# Patient Record
Sex: Female | Born: 1995
Health system: Southern US, Community
[De-identification: ages and names within clinical notes are randomized; demographics above are authoritative.]

## PROBLEM LIST (undated history)

## (undated) DIAGNOSIS — I4510 Unspecified right bundle-branch block: Secondary | ICD-10-CM

## (undated) DIAGNOSIS — E669 Obesity, unspecified: Secondary | ICD-10-CM

## (undated) DIAGNOSIS — E785 Hyperlipidemia, unspecified: Secondary | ICD-10-CM

## (undated) DIAGNOSIS — G4700 Insomnia, unspecified: Secondary | ICD-10-CM

## (undated) DIAGNOSIS — I498 Other specified cardiac arrhythmias: Secondary | ICD-10-CM

## (undated) DIAGNOSIS — F410 Panic disorder [episodic paroxysmal anxiety] without agoraphobia: Secondary | ICD-10-CM

## (undated) DIAGNOSIS — R079 Chest pain, unspecified: Secondary | ICD-10-CM

## (undated) DIAGNOSIS — F419 Anxiety disorder, unspecified: Secondary | ICD-10-CM

## (undated) DIAGNOSIS — M5126 Other intervertebral disc displacement, lumbar region: Secondary | ICD-10-CM

## (undated) HISTORY — DX: Insomnia, unspecified: G47.00

## (undated) HISTORY — DX: Panic disorder (episodic paroxysmal anxiety): F41.0

## (undated) HISTORY — DX: Unspecified right bundle-branch block: I45.10

## (undated) HISTORY — DX: Other specified cardiac arrhythmias: I49.8

## (undated) HISTORY — PX: RHINOPLASTY: SUR1284

## (undated) HISTORY — DX: Obesity, unspecified: E66.9

## (undated) HISTORY — DX: Chest pain, unspecified: R07.9

## (undated) HISTORY — DX: Hyperlipidemia, unspecified: E78.5

---
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
## 2015-05-20 DIAGNOSIS — L709 Acne, unspecified: Secondary | ICD-10-CM | POA: Diagnosis not present

## 2015-05-25 DIAGNOSIS — L700 Acne vulgaris: Secondary | ICD-10-CM | POA: Diagnosis not present

## 2015-08-05 DIAGNOSIS — Z202 Contact with and (suspected) exposure to infections with a predominantly sexual mode of transmission: Secondary | ICD-10-CM | POA: Diagnosis not present

## 2015-08-05 DIAGNOSIS — Z113 Encounter for screening for infections with a predominantly sexual mode of transmission: Secondary | ICD-10-CM | POA: Diagnosis not present

## 2015-08-11 DIAGNOSIS — H00012 Hordeolum externum right lower eyelid: Secondary | ICD-10-CM | POA: Diagnosis not present

## 2015-08-11 DIAGNOSIS — H00015 Hordeolum externum left lower eyelid: Secondary | ICD-10-CM | POA: Diagnosis not present

## 2015-08-11 MED FILL — ERYTHROMYCIN EYE OINTMENT: 5 | 7 days supply | Qty: 4 | Fill #0

## 2015-11-16 DIAGNOSIS — Z01419 Encounter for gynecological examination (general) (routine) without abnormal findings: Secondary | ICD-10-CM | POA: Diagnosis not present

## 2015-11-16 DIAGNOSIS — Z113 Encounter for screening for infections with a predominantly sexual mode of transmission: Secondary | ICD-10-CM | POA: Diagnosis not present

## 2015-11-16 DIAGNOSIS — Z6820 Body mass index (BMI) 20.0-20.9, adult: Secondary | ICD-10-CM | POA: Diagnosis not present

## 2015-11-20 DIAGNOSIS — L709 Acne, unspecified: Secondary | ICD-10-CM | POA: Diagnosis not present

## 2015-11-20 MED FILL — OXYCODONE-APAP 7.5-325 MG: 7.5-325 | 4 days supply | Qty: 40 | Fill #0

## 2015-11-20 MED FILL — ONDANSETRON ODT 8 MG TABLET: 8 | 3 days supply | Qty: 8 | Fill #0

## 2015-11-23 MED FILL — TRETINOIN 0.025% CREAM: 0.025 | 90 days supply | Qty: 45 | Fill #0

## 2016-02-26 DIAGNOSIS — M533 Sacrococcygeal disorders, not elsewhere classified: Secondary | ICD-10-CM | POA: Diagnosis not present

## 2016-02-26 DIAGNOSIS — M9905 Segmental and somatic dysfunction of pelvic region: Secondary | ICD-10-CM | POA: Diagnosis not present

## 2016-02-26 DIAGNOSIS — M546 Pain in thoracic spine: Secondary | ICD-10-CM | POA: Diagnosis not present

## 2016-02-26 DIAGNOSIS — M9903 Segmental and somatic dysfunction of lumbar region: Secondary | ICD-10-CM | POA: Diagnosis not present

## 2016-02-26 DIAGNOSIS — M9902 Segmental and somatic dysfunction of thoracic region: Secondary | ICD-10-CM | POA: Diagnosis not present

## 2016-02-26 DIAGNOSIS — M545 Low back pain: Secondary | ICD-10-CM | POA: Diagnosis not present

## 2016-02-29 DIAGNOSIS — M9903 Segmental and somatic dysfunction of lumbar region: Secondary | ICD-10-CM | POA: Diagnosis not present

## 2016-02-29 DIAGNOSIS — M9905 Segmental and somatic dysfunction of pelvic region: Secondary | ICD-10-CM | POA: Diagnosis not present

## 2016-02-29 DIAGNOSIS — M546 Pain in thoracic spine: Secondary | ICD-10-CM | POA: Diagnosis not present

## 2016-02-29 DIAGNOSIS — M533 Sacrococcygeal disorders, not elsewhere classified: Secondary | ICD-10-CM | POA: Diagnosis not present

## 2016-02-29 DIAGNOSIS — M9902 Segmental and somatic dysfunction of thoracic region: Secondary | ICD-10-CM | POA: Diagnosis not present

## 2016-02-29 DIAGNOSIS — M545 Low back pain: Secondary | ICD-10-CM | POA: Diagnosis not present

## 2016-03-03 DIAGNOSIS — M545 Low back pain: Secondary | ICD-10-CM | POA: Diagnosis not present

## 2016-03-03 DIAGNOSIS — M546 Pain in thoracic spine: Secondary | ICD-10-CM | POA: Diagnosis not present

## 2016-03-03 DIAGNOSIS — M9902 Segmental and somatic dysfunction of thoracic region: Secondary | ICD-10-CM | POA: Diagnosis not present

## 2016-03-03 DIAGNOSIS — M9903 Segmental and somatic dysfunction of lumbar region: Secondary | ICD-10-CM | POA: Diagnosis not present

## 2016-03-03 DIAGNOSIS — M9905 Segmental and somatic dysfunction of pelvic region: Secondary | ICD-10-CM | POA: Diagnosis not present

## 2016-03-03 DIAGNOSIS — M533 Sacrococcygeal disorders, not elsewhere classified: Secondary | ICD-10-CM | POA: Diagnosis not present

## 2016-03-23 MED FILL — TRETINOIN 0.025% CREAM: 0.025 | 90 days supply | Qty: 45 | Fill #1

## 2016-05-30 DIAGNOSIS — L700 Acne vulgaris: Secondary | ICD-10-CM | POA: Diagnosis not present

## 2016-06-02 DIAGNOSIS — L709 Acne, unspecified: Secondary | ICD-10-CM | POA: Diagnosis not present

## 2016-06-28 MED FILL — TRETINOIN 0.025% CREAM: 0.025 | 90 days supply | Qty: 45 | Fill #2

## 2016-07-08 DIAGNOSIS — L700 Acne vulgaris: Secondary | ICD-10-CM | POA: Diagnosis not present

## 2016-08-18 DIAGNOSIS — L700 Acne vulgaris: Secondary | ICD-10-CM | POA: Diagnosis not present

## 2016-12-23 MED FILL — TRETINOIN 0.025% CREAM: 0.025 | 30 days supply | Qty: 45 | Fill #0

## 2017-02-01 DIAGNOSIS — Z6822 Body mass index (BMI) 22.0-22.9, adult: Secondary | ICD-10-CM | POA: Diagnosis not present

## 2017-02-01 DIAGNOSIS — Z113 Encounter for screening for infections with a predominantly sexual mode of transmission: Secondary | ICD-10-CM | POA: Diagnosis not present

## 2017-02-01 DIAGNOSIS — Z01419 Encounter for gynecological examination (general) (routine) without abnormal findings: Secondary | ICD-10-CM | POA: Diagnosis not present

## 2017-03-20 MED FILL — TRETINOIN 0.025% CREAM: 0.025 | 60 days supply | Qty: 90 | Fill #1

## 2017-10-16 MED FILL — TRETINOIN 0.025% CREAM: 0.025 | 30 days supply | Qty: 45 | Fill #2

## 2017-11-20 DIAGNOSIS — Z1389 Encounter for screening for other disorder: Secondary | ICD-10-CM | POA: Diagnosis not present

## 2017-11-20 DIAGNOSIS — Z6823 Body mass index (BMI) 23.0-23.9, adult: Secondary | ICD-10-CM | POA: Diagnosis not present

## 2017-11-20 DIAGNOSIS — Z0000 Encounter for general adult medical examination without abnormal findings: Secondary | ICD-10-CM | POA: Diagnosis not present

## 2018-03-07 DIAGNOSIS — L709 Acne, unspecified: Secondary | ICD-10-CM | POA: Diagnosis not present

## 2018-04-11 DIAGNOSIS — Z113 Encounter for screening for infections with a predominantly sexual mode of transmission: Secondary | ICD-10-CM | POA: Diagnosis not present

## 2018-04-11 DIAGNOSIS — Z6825 Body mass index (BMI) 25.0-25.9, adult: Secondary | ICD-10-CM | POA: Diagnosis not present

## 2018-04-11 DIAGNOSIS — Z01419 Encounter for gynecological examination (general) (routine) without abnormal findings: Secondary | ICD-10-CM | POA: Diagnosis not present

## 2018-04-12 DIAGNOSIS — L700 Acne vulgaris: Secondary | ICD-10-CM | POA: Diagnosis not present

## 2018-04-12 DIAGNOSIS — Z5181 Encounter for therapeutic drug level monitoring: Secondary | ICD-10-CM | POA: Diagnosis not present

## 2018-04-17 MED FILL — MYORISAN 40 MG CAPSULE: 40 | 30 days supply | Qty: 30 | Fill #0

## 2018-05-07 DIAGNOSIS — R1031 Right lower quadrant pain: Secondary | ICD-10-CM | POA: Diagnosis not present

## 2018-05-07 DIAGNOSIS — R82998 Other abnormal findings in urine: Secondary | ICD-10-CM | POA: Diagnosis not present

## 2018-05-07 DIAGNOSIS — Z3200 Encounter for pregnancy test, result unknown: Secondary | ICD-10-CM | POA: Diagnosis not present

## 2018-05-16 DIAGNOSIS — K130 Diseases of lips: Secondary | ICD-10-CM | POA: Diagnosis not present

## 2018-05-16 DIAGNOSIS — Z5181 Encounter for therapeutic drug level monitoring: Secondary | ICD-10-CM | POA: Diagnosis not present

## 2018-05-16 DIAGNOSIS — L709 Acne, unspecified: Secondary | ICD-10-CM | POA: Diagnosis not present

## 2018-05-21 MED FILL — MYORISAN 40 MG CAPSULE: 40 | 30 days supply | Qty: 30 | Fill #0

## 2018-06-26 DIAGNOSIS — Z5181 Encounter for therapeutic drug level monitoring: Secondary | ICD-10-CM | POA: Diagnosis not present

## 2018-06-26 DIAGNOSIS — K130 Diseases of lips: Secondary | ICD-10-CM | POA: Diagnosis not present

## 2018-06-26 DIAGNOSIS — D229 Melanocytic nevi, unspecified: Secondary | ICD-10-CM | POA: Diagnosis not present

## 2018-07-26 DIAGNOSIS — Z6824 Body mass index (BMI) 24.0-24.9, adult: Secondary | ICD-10-CM | POA: Diagnosis not present

## 2018-07-26 DIAGNOSIS — H00019 Hordeolum externum unspecified eye, unspecified eyelid: Secondary | ICD-10-CM | POA: Diagnosis not present

## 2018-07-26 DIAGNOSIS — Z1331 Encounter for screening for depression: Secondary | ICD-10-CM | POA: Diagnosis not present

## 2018-11-18 ENCOUNTER — Emergency Department (HOSPITAL_COMMUNITY)
Admission: EM | Admit: 2018-11-18 | Discharge: 2018-11-18 | Disposition: A | Discharge status: Home / Self Care | Payer: 59 | Attending: Emergency Medicine | Admitting: Emergency Medicine

## 2018-11-18 ENCOUNTER — Other Ambulatory Visit: Payer: Self-pay

## 2018-11-18 DIAGNOSIS — Z043 Encounter for examination and observation following other accident: Secondary | ICD-10-CM | POA: Insufficient documentation

## 2018-11-18 DIAGNOSIS — Z5321 Procedure and treatment not carried out due to patient leaving prior to being seen by health care provider: Secondary | ICD-10-CM | POA: Diagnosis not present

## 2018-11-18 DIAGNOSIS — W540XXA Bitten by dog, initial encounter: Secondary | ICD-10-CM | POA: Insufficient documentation

## 2018-11-18 NOTE — ED Triage Notes (Signed)
Pt was bitten by a neighbor's dog. States she feels confident that dog does not have rabies, but is concerned for infection.

## 2018-11-18 NOTE — ED Notes (Signed)
Pt stated she is leaving b/c of wait.

## 2018-12-10 DIAGNOSIS — Z0000 Encounter for general adult medical examination without abnormal findings: Secondary | ICD-10-CM | POA: Diagnosis not present

## 2018-12-12 DIAGNOSIS — Z0000 Encounter for general adult medical examination without abnormal findings: Secondary | ICD-10-CM | POA: Diagnosis not present

## 2018-12-12 DIAGNOSIS — H00019 Hordeolum externum unspecified eye, unspecified eyelid: Secondary | ICD-10-CM | POA: Diagnosis not present

## 2018-12-12 DIAGNOSIS — E785 Hyperlipidemia, unspecified: Secondary | ICD-10-CM | POA: Diagnosis not present

## 2018-12-12 DIAGNOSIS — Z1331 Encounter for screening for depression: Secondary | ICD-10-CM | POA: Diagnosis not present

## 2019-04-02 DIAGNOSIS — I890 Lymphedema, not elsewhere classified: Secondary | ICD-10-CM | POA: Diagnosis not present

## 2019-04-16 DIAGNOSIS — Z01419 Encounter for gynecological examination (general) (routine) without abnormal findings: Secondary | ICD-10-CM | POA: Diagnosis not present

## 2019-04-16 DIAGNOSIS — Z6826 Body mass index (BMI) 26.0-26.9, adult: Secondary | ICD-10-CM | POA: Diagnosis not present

## 2019-04-16 DIAGNOSIS — Z113 Encounter for screening for infections with a predominantly sexual mode of transmission: Secondary | ICD-10-CM | POA: Diagnosis not present

## 2019-06-12 MED FILL — METHOCARBAMOL 500 MG TABS: 500 | 7 days supply | Qty: 20 | Fill #0

## 2019-07-10 DIAGNOSIS — Z1152 Encounter for screening for COVID-19: Secondary | ICD-10-CM | POA: Diagnosis not present

## 2019-07-10 DIAGNOSIS — J0100 Acute maxillary sinusitis, unspecified: Secondary | ICD-10-CM | POA: Diagnosis not present

## 2019-07-10 MED FILL — AZITHROMYCIN 250 MG TABLET: 250 | 5 days supply | Qty: 6 | Fill #0

## 2019-08-25 DIAGNOSIS — Z20828 Contact with and (suspected) exposure to other viral communicable diseases: Secondary | ICD-10-CM | POA: Diagnosis not present

## 2019-11-22 ENCOUNTER — Other Ambulatory Visit: Payer: Self-pay | Admitting: Neurological Surgery

## 2019-11-22 ENCOUNTER — Other Ambulatory Visit (HOSPITAL_COMMUNITY): Payer: Self-pay | Admitting: Neurological Surgery

## 2019-11-22 DIAGNOSIS — M5416 Radiculopathy, lumbar region: Secondary | ICD-10-CM | POA: Diagnosis not present

## 2019-11-22 DIAGNOSIS — M4316 Spondylolisthesis, lumbar region: Secondary | ICD-10-CM

## 2019-11-22 DIAGNOSIS — M545 Low back pain: Secondary | ICD-10-CM | POA: Diagnosis not present

## 2019-12-03 ENCOUNTER — Ambulatory Visit (HOSPITAL_COMMUNITY)
Admission: RE | Admit: 2019-12-03 | Discharge: 2019-12-03 | Disposition: A | Discharge status: Home / Self Care | Payer: 59 | Source: Ambulatory Visit | Attending: Neurological Surgery | Admitting: Neurological Surgery

## 2019-12-03 DIAGNOSIS — M4316 Spondylolisthesis, lumbar region: Secondary | ICD-10-CM | POA: Insufficient documentation

## 2019-12-03 DIAGNOSIS — M545 Low back pain: Secondary | ICD-10-CM | POA: Diagnosis not present

## 2019-12-05 DIAGNOSIS — M5127 Other intervertebral disc displacement, lumbosacral region: Secondary | ICD-10-CM | POA: Diagnosis not present

## 2019-12-09 DIAGNOSIS — M5417 Radiculopathy, lumbosacral region: Secondary | ICD-10-CM | POA: Diagnosis not present

## 2019-12-09 DIAGNOSIS — M5117 Intervertebral disc disorders with radiculopathy, lumbosacral region: Secondary | ICD-10-CM | POA: Diagnosis not present

## 2020-01-20 DIAGNOSIS — Z113 Encounter for screening for infections with a predominantly sexual mode of transmission: Secondary | ICD-10-CM | POA: Diagnosis not present

## 2020-01-20 DIAGNOSIS — Z3202 Encounter for pregnancy test, result negative: Secondary | ICD-10-CM | POA: Diagnosis not present

## 2020-01-20 DIAGNOSIS — Z30433 Encounter for removal and reinsertion of intrauterine contraceptive device: Secondary | ICD-10-CM | POA: Diagnosis not present

## 2020-02-12 DIAGNOSIS — M5127 Other intervertebral disc displacement, lumbosacral region: Secondary | ICD-10-CM | POA: Diagnosis not present

## 2020-02-20 ENCOUNTER — Other Ambulatory Visit (HOSPITAL_COMMUNITY): Payer: Self-pay | Admitting: Internal Medicine

## 2020-02-20 DIAGNOSIS — F909 Attention-deficit hyperactivity disorder, unspecified type: Secondary | ICD-10-CM | POA: Diagnosis not present

## 2020-02-20 DIAGNOSIS — F410 Panic disorder [episodic paroxysmal anxiety] without agoraphobia: Secondary | ICD-10-CM | POA: Diagnosis not present

## 2020-02-20 MED FILL — PARoxetine HCL 10 MG TABS: 10 | 30 days supply | Qty: 30 | Fill #0

## 2020-03-03 DIAGNOSIS — M5117 Intervertebral disc disorders with radiculopathy, lumbosacral region: Secondary | ICD-10-CM | POA: Diagnosis not present

## 2020-03-03 DIAGNOSIS — M5417 Radiculopathy, lumbosacral region: Secondary | ICD-10-CM | POA: Diagnosis not present

## 2020-03-25 MED FILL — PARoxetine HCL 10 MG TABS: 10 | 30 days supply | Qty: 30 | Fill #1

## 2020-04-22 MED FILL — PARoxetine HCL 10 MG TABS: 10 | 30 days supply | Qty: 30 | Fill #2

## 2020-05-25 MED FILL — PARoxetine HCL 10 MG TABS: 10 | 30 days supply | Qty: 30 | Fill #3

## 2020-06-22 MED FILL — PARoxetine HCL 10 MG TABS: 10 | 30 days supply | Qty: 30 | Fill #4

## 2020-07-28 ENCOUNTER — Other Ambulatory Visit (HOSPITAL_COMMUNITY): Payer: Self-pay | Admitting: Internal Medicine

## 2020-07-28 MED FILL — PARoxetine HCL 10 MG TABS: 10 | 90 days supply | Qty: 90 | Fill #0

## 2020-12-14 ENCOUNTER — Other Ambulatory Visit (HOSPITAL_COMMUNITY): Payer: Self-pay

## 2020-12-14 MED FILL — Paroxetine HCl Tab 10 MG: ORAL | 90 days supply | Qty: 90 | Fill #0 | Status: AC

## 2021-03-19 ENCOUNTER — Other Ambulatory Visit (HOSPITAL_COMMUNITY): Payer: Self-pay

## 2021-03-19 MED FILL — Paroxetine HCl Tab 10 MG: ORAL | 30 days supply | Qty: 30 | Fill #1 | Status: AC

## 2021-04-21 ENCOUNTER — Other Ambulatory Visit (HOSPITAL_COMMUNITY): Payer: Self-pay

## 2021-04-21 MED FILL — Paroxetine HCl Tab 10 MG: ORAL | 30 days supply | Qty: 30 | Fill #2 | Status: AC

## 2021-05-24 ENCOUNTER — Other Ambulatory Visit (HOSPITAL_COMMUNITY): Payer: Self-pay

## 2021-05-24 MED FILL — Paroxetine HCl Tab 10 MG: ORAL | 30 days supply | Qty: 30 | Fill #3 | Status: AC

## 2021-06-23 ENCOUNTER — Other Ambulatory Visit (HOSPITAL_COMMUNITY): Payer: Self-pay

## 2021-06-25 ENCOUNTER — Other Ambulatory Visit (HOSPITAL_COMMUNITY): Payer: Self-pay

## 2021-06-25 MED ORDER — PAROXETINE HCL 10 MG PO TABS
10.0000 mg | ORAL_TABLET | Freq: Every day | ORAL | 0 refills | Status: DC
  Filled 2021-06-25: qty 30, 30d supply, fill #0

## 2021-06-30 DIAGNOSIS — F909 Attention-deficit hyperactivity disorder, unspecified type: Secondary | ICD-10-CM | POA: Diagnosis not present

## 2021-06-30 DIAGNOSIS — R7989 Other specified abnormal findings of blood chemistry: Secondary | ICD-10-CM | POA: Diagnosis not present

## 2021-06-30 DIAGNOSIS — F410 Panic disorder [episodic paroxysmal anxiety] without agoraphobia: Secondary | ICD-10-CM | POA: Diagnosis not present

## 2021-07-01 ENCOUNTER — Other Ambulatory Visit (HOSPITAL_COMMUNITY): Payer: Self-pay

## 2021-07-01 MED ORDER — PAROXETINE HCL 20 MG PO TABS
20.0000 mg | ORAL_TABLET | ORAL | 3 refills | Status: DC
  Filled 2021-07-01: qty 30, 30d supply, fill #0
  Filled 2021-08-31: qty 30, 30d supply, fill #1
  Filled 2021-10-06: qty 30, 30d supply, fill #2
  Filled 2021-11-05: qty 30, 30d supply, fill #3
  Filled 2021-12-06: qty 90, 90d supply, fill #4
  Filled 2022-03-09: qty 30, 30d supply, fill #5
  Filled 2022-04-08: qty 30, 30d supply, fill #6
  Filled 2022-05-11: qty 30, 30d supply, fill #7

## 2021-08-31 ENCOUNTER — Other Ambulatory Visit (HOSPITAL_COMMUNITY): Payer: Self-pay

## 2021-10-06 ENCOUNTER — Other Ambulatory Visit (HOSPITAL_COMMUNITY): Payer: Self-pay

## 2021-11-05 ENCOUNTER — Other Ambulatory Visit (HOSPITAL_COMMUNITY): Payer: Self-pay

## 2021-12-06 ENCOUNTER — Other Ambulatory Visit (HOSPITAL_COMMUNITY): Payer: Self-pay

## 2022-02-25 ENCOUNTER — Other Ambulatory Visit (HOSPITAL_COMMUNITY): Payer: Self-pay

## 2022-02-25 MED ORDER — MELOXICAM 15 MG PO TABS
15.0000 mg | ORAL_TABLET | Freq: Every day | ORAL | 3 refills | Status: DC | PRN
  Filled 2022-02-25: qty 30, 30d supply, fill #0

## 2022-02-25 MED ORDER — GABAPENTIN 100 MG PO CAPS
100.0000 mg | ORAL_CAPSULE | Freq: Two times a day (BID) | ORAL | 1 refills | Status: DC | PRN
  Filled 2022-02-25: qty 60, 10d supply, fill #0

## 2022-03-09 ENCOUNTER — Other Ambulatory Visit (HOSPITAL_COMMUNITY): Payer: Self-pay

## 2022-03-10 ENCOUNTER — Other Ambulatory Visit (HOSPITAL_COMMUNITY): Payer: Self-pay

## 2022-04-08 ENCOUNTER — Other Ambulatory Visit (HOSPITAL_COMMUNITY): Payer: Self-pay

## 2022-05-11 ENCOUNTER — Other Ambulatory Visit (HOSPITAL_COMMUNITY): Payer: Self-pay

## 2022-05-12 ENCOUNTER — Other Ambulatory Visit (HOSPITAL_COMMUNITY): Payer: Self-pay

## 2022-05-13 ENCOUNTER — Other Ambulatory Visit (HOSPITAL_COMMUNITY): Payer: Self-pay

## 2022-05-13 MED ORDER — WEGOVY 1 MG/0.5ML ~~LOC~~ SOAJ
1.0000 mg | SUBCUTANEOUS | 1 refills | Status: DC
  Filled 2022-05-13: qty 2, 28d supply, fill #0

## 2022-05-13 MED ORDER — WEGOVY 0.5 MG/0.5ML ~~LOC~~ SOAJ
0.5000 mg | SUBCUTANEOUS | 0 refills | Status: DC
  Filled 2022-05-13: qty 2, 28d supply, fill #0

## 2022-05-16 ENCOUNTER — Other Ambulatory Visit (HOSPITAL_COMMUNITY): Payer: Self-pay

## 2022-05-17 ENCOUNTER — Other Ambulatory Visit (HOSPITAL_COMMUNITY): Payer: Self-pay

## 2022-05-17 MED ORDER — OZEMPIC (0.25 OR 0.5 MG/DOSE) 2 MG/3ML ~~LOC~~ SOPN
0.2500 mg | PEN_INJECTOR | SUBCUTANEOUS | 1 refills | Status: AC
  Filled 2022-05-17: qty 3, 34d supply, fill #0
  Filled 2022-05-20 (×2): qty 3, 28d supply, fill #0

## 2022-05-20 ENCOUNTER — Other Ambulatory Visit (HOSPITAL_COMMUNITY): Payer: Self-pay

## 2022-05-25 ENCOUNTER — Other Ambulatory Visit (HOSPITAL_COMMUNITY): Payer: Self-pay

## 2022-05-25 MED ORDER — SERTRALINE HCL 50 MG PO TABS
50.0000 mg | ORAL_TABLET | Freq: Every day | ORAL | 0 refills | Status: DC
  Filled 2022-05-25: qty 30, 30d supply, fill #0

## 2022-05-26 ENCOUNTER — Other Ambulatory Visit (HOSPITAL_COMMUNITY): Payer: Self-pay

## 2022-06-14 ENCOUNTER — Other Ambulatory Visit (HOSPITAL_COMMUNITY): Payer: Self-pay

## 2022-06-14 MED ORDER — SERTRALINE HCL 100 MG PO TABS
100.0000 mg | ORAL_TABLET | Freq: Every day | ORAL | 0 refills | Status: DC
  Filled 2022-06-14: qty 30, 30d supply, fill #0
  Filled 2022-07-29: qty 30, 30d supply, fill #1
  Filled 2022-08-30: qty 30, 30d supply, fill #2

## 2022-06-15 ENCOUNTER — Other Ambulatory Visit (HOSPITAL_COMMUNITY): Payer: Self-pay

## 2022-07-29 ENCOUNTER — Other Ambulatory Visit (HOSPITAL_COMMUNITY): Payer: Self-pay

## 2022-08-30 ENCOUNTER — Other Ambulatory Visit (HOSPITAL_COMMUNITY): Payer: Self-pay

## 2022-09-30 ENCOUNTER — Other Ambulatory Visit (HOSPITAL_COMMUNITY): Payer: Self-pay

## 2022-09-30 MED ORDER — SERTRALINE HCL 100 MG PO TABS
100.0000 mg | ORAL_TABLET | Freq: Every day | ORAL | 11 refills | Status: DC
  Filled 2022-09-30: qty 30, 30d supply, fill #0
  Filled 2022-11-02: qty 30, 30d supply, fill #1
  Filled 2022-12-05: qty 30, 30d supply, fill #2

## 2022-11-02 ENCOUNTER — Other Ambulatory Visit (HOSPITAL_COMMUNITY): Payer: Self-pay

## 2022-12-05 ENCOUNTER — Other Ambulatory Visit (HOSPITAL_COMMUNITY): Payer: Self-pay

## 2022-12-05 ENCOUNTER — Other Ambulatory Visit: Payer: Self-pay

## 2022-12-22 ENCOUNTER — Emergency Department (HOSPITAL_BASED_OUTPATIENT_CLINIC_OR_DEPARTMENT_OTHER)
Admission: EM | Admit: 2022-12-22 | Discharge: 2022-12-22 | Disposition: A | Discharge status: Home / Self Care | Payer: BC Managed Care – PPO | Attending: Emergency Medicine | Admitting: Emergency Medicine

## 2022-12-22 ENCOUNTER — Emergency Department (HOSPITAL_BASED_OUTPATIENT_CLINIC_OR_DEPARTMENT_OTHER): Payer: BC Managed Care – PPO | Admitting: Radiology

## 2022-12-22 ENCOUNTER — Other Ambulatory Visit: Payer: Self-pay

## 2022-12-22 ENCOUNTER — Encounter (HOSPITAL_BASED_OUTPATIENT_CLINIC_OR_DEPARTMENT_OTHER): Payer: Self-pay

## 2022-12-22 DIAGNOSIS — R0789 Other chest pain: Secondary | ICD-10-CM | POA: Diagnosis not present

## 2022-12-22 DIAGNOSIS — D72829 Elevated white blood cell count, unspecified: Secondary | ICD-10-CM | POA: Diagnosis not present

## 2022-12-22 DIAGNOSIS — R079 Chest pain, unspecified: Secondary | ICD-10-CM | POA: Diagnosis present

## 2022-12-22 DIAGNOSIS — F419 Anxiety disorder, unspecified: Secondary | ICD-10-CM | POA: Diagnosis not present

## 2022-12-22 HISTORY — DX: Anxiety disorder, unspecified: F41.9

## 2022-12-22 LAB — BASIC METABOLIC PANEL
Anion gap: 9 (ref 5–15)
BUN: 11 mg/dL (ref 6–20)
CO2: 28 mmol/L (ref 22–32)
Calcium: 10.6 mg/dL — ABNORMAL HIGH (ref 8.9–10.3)
Chloride: 102 mmol/L (ref 98–111)
Creatinine, Ser: 0.69 mg/dL (ref 0.44–1.00)
GFR, Estimated: >60 mL/min (ref >60)
Glucose, Bld: 87 mg/dL (ref 70–99)
Potassium: 4 mmol/L (ref 3.5–5.1)
Sodium: 139 mmol/L (ref 135–145)

## 2022-12-22 LAB — CBC
HCT: 45.2 % (ref 36.0–46.0)
Hemoglobin: 15.9 g/dL — ABNORMAL HIGH (ref 12.0–15.0)
MCH: 31.3 pg (ref 26.0–34.0)
MCHC: 35.2 g/dL (ref 30.0–36.0)
MCV: 89 fL (ref 80.0–100.0)
Platelets: 374 10*3/uL (ref 150–400)
RBC: 5.08 MIL/uL (ref 3.87–5.11)
RDW: 11.9 % (ref 11.5–15.5)
WBC: 11 10*3/uL — ABNORMAL HIGH (ref 4.0–10.5)
nRBC: 0 % (ref 0.0–0.2)

## 2022-12-22 LAB — TROPONIN I (HIGH SENSITIVITY)
Troponin I (High Sensitivity): 2 ng/L (ref <18)
Troponin I (High Sensitivity): <2 ng/L (ref <18)

## 2022-12-22 NOTE — ED Triage Notes (Addendum)
Pt arrived POV for CP/chest discomfort that started tonight, pt denies radiation with CP, no SOB, or n/v. Pt reports has chronic panic attacks and anxiety. Pt seen at [facility] on Sunday for same and dx with RBBB and was advised to f/u with cardiologist which pt has not got an appt yet, started hydroxyzine. VSS, NAD noted.

## 2022-12-22 NOTE — ED Provider Notes (Signed)
[facility] AT [facility] Provider Note  CSN: [id number] Arrival date & time: 12/22/22 4098  Chief Complaint(s) Chest Pain  HPI Shannon Schaefer is a 27 y.o. female with a past medical history listed below including anxiety who presents to the department with left-sided chest discomfort at rest.  Pain is pressure-like.  Nonradiating.  Nonexertional.  No alleviating or aggravating factors.  Similar to prior episodes of anxiety attacks.  Reports that she had pain starting around 9 PM this evening.  Reports taken Vistaril which made the pain go away.  Around 11 PM the pain came back and more intense.  This prompted her visit to the emergency department.  Pain lasted approximately 3 hours and resolved while waiting to be seen.  Currently asymptomatic.  She denies any recent fevers or infections.  No cough or congestion.  No nausea or vomiting.  No shortness of breath.  Denies any prior history of DVT/PE.  No OCP use.  No history of known cancer or autoimmune disorders.  Patient did report having a recent travel to adjacent state but was not in the vehicle for more than 4 to 6 hours without movement.  She did report that she had similar episode yesterday and was seen in [place] ER.  Workup there was reassuring including a negative D-dimer. They Rx'd the Vistaril for anxiety.  The history is provided by the patient.    Past Medical History Past Medical History:  Diagnosis Date   Anxiety    There are no problems to display for this patient.  Home Medication(s) Prior to Admission medications   Medication Sig Start Date End Date Taking? Authorizing Provider  Calcium Carbonate-Vit D-Min (CALCIUM 1200 PO) Take by mouth.    [provider]  diclofenac sodium (VOLTAREN) 1 % GEL Apply 1 application topically 4 (four) times daily. 07/31/11   Rodolph Bong, MD  gabapentin (NEURONTIN) 100 MG capsule Take 1-3 capsules (100-300 mg total) by mouth 2 (two) times  daily as needed for back pain 02/25/22     meloxicam (MOBIC) 15 MG tablet Take 1 tablet (15 mg total) by mouth daily as needed for back pain 02/25/22     Multiple Vitamins-Minerals (MULTIVITAMIN PO) Take by mouth daily.    [provider]  PARoxetine (PAXIL) 10 MG tablet TAKE 1 TABLET BY MOUTH ONCE A DAY IN THE MORNING 07/28/20 07/28/21  Avva, Ravisankar, MD  PARoxetine (PAXIL) 10 MG tablet TAKE 1 TABLET BY MOUTH IN THE MORNING ONCE A DAY 02/20/20 02/19/21  Avva, Joylene Draft, MD  PARoxetine (PAXIL) 10 MG tablet Take 1 tablet (10 mg total) by mouth in the morning 06/25/21     PARoxetine (PAXIL) 20 MG tablet Take 1 tablet (20 mg total) by mouth once daily 06/30/21     Semaglutide,0.25 or 0.5MG /DOS, (OZEMPIC, 0.25 OR 0.5 MG/DOSE,) 2 MG/3ML SOPN Inject 0.25 mg into the skin weekly for 2 weeks and then inject 0.5mg  weekly as directed 05/16/22     Semaglutide-Weight Management (WEGOVY) 0.5 MG/0.5ML SOAJ Inject 0.5 mg into the skin once a week. 05/12/22     Semaglutide-Weight Management (WEGOVY) 1 MG/0.5ML SOAJ Inject 1 mg into the skin once a week. 05/12/22     sertraline (ZOLOFT) 100 MG tablet Take 1 tablet (100 mg total) by mouth daily. 09/30/22     sertraline (ZOLOFT) 50 MG tablet Take 1 tablet (50 mg total) by mouth daily. 05/25/22     vitamin C (ASCORBIC ACID) 500 MG tablet Take 500  mg by mouth daily.    [provider]                                                                                                                                    Allergies Patient has no known allergies.  Review of Systems Review of Systems As noted in HPI  Physical Exam Vital Signs  I have reviewed the triage vital signs BP 112/83 (BP Location: Right Arm)   Pulse 83   Temp 98 F (36.7 C) (Oral)   Resp 19   Ht 5\' 5"  (1.651 m)   Wt 68 kg   SpO2 100%   BMI 24.96 kg/m   Physical Exam Vitals reviewed.  Constitutional:      General: She is not in acute distress.    Appearance: She is  well-developed. She is not diaphoretic.  HENT:     Head: Normocephalic and atraumatic.     Nose: Nose normal.  Eyes:     General: No scleral icterus.       Right eye: No discharge.        Left eye: No discharge.     Conjunctiva/sclera: Conjunctivae normal.     Pupils: Pupils are equal, round, and reactive to light.  Cardiovascular:     Rate and Rhythm: Normal rate and regular rhythm.     Heart sounds: No murmur heard.    No friction rub. No gallop.  Pulmonary:     Effort: Pulmonary effort is normal. No respiratory distress.     Breath sounds: Normal breath sounds. No stridor. No rales.  Abdominal:     General: There is no distension.     Palpations: Abdomen is soft.     Tenderness: There is no abdominal tenderness.  Musculoskeletal:        General: No tenderness.     Cervical back: Normal range of motion and neck supple.  Skin:    General: Skin is warm and dry.     Findings: No erythema or rash.  Neurological:     Mental Status: She is alert and oriented to person, place, and time.     ED Results and Treatments Labs (all labs ordered are listed, but only abnormal results are displayed) Labs Reviewed  BASIC METABOLIC PANEL - Abnormal; Notable for the following components:      Result Value   Calcium 10.6 (*)    All other components within normal limits  CBC - Abnormal; Notable for the following components:   WBC 11.0 (*)    Hemoglobin 15.9 (*)    All other components within normal limits  PREGNANCY, URINE  TROPONIN I (HIGH SENSITIVITY)  TROPONIN I (HIGH SENSITIVITY)  EKG  EKG Interpretation Date/Time:  Thursday December 22 2022 03:43:21 EDT Ventricular Rate:  69 PR Interval:  169 QRS Duration:  109 QT Interval:  395 QTC Calculation: 420 R Axis:   60  Text Interpretation: Sinus rhythm RSR' in V1 or V2, probably normal variant  Confirmed by  Drema Pry [phone]) on 12/22/2022 4:31:49 AM       Radiology DG Chest 2 View  Result Date: 12/22/2022 CLINICAL DATA:  Chest pain EXAM: CHEST - 2 VIEW COMPARISON:  None Available. FINDINGS: The heart size and mediastinal contours are within normal limits. Both lungs are clear. The visualized skeletal structures are unremarkable. No pneumothorax. IMPRESSION: Normal study. Electronically Signed   By: Charlett Nose M.D.   On: 12/22/2022 01:33    Medications Ordered in ED Medications - No data to display Procedures Procedures  (including critical care time) Medical Decision Making / ED Course   Medical Decision Making Amount and/or Complexity of Data Reviewed Labs: ordered. Decision-making details documented in ED Course. Radiology: ordered and independent interpretation performed. Decision-making details documented in ED Course. ECG/medicine tests: ordered and independent interpretation performed. Decision-making details documented in ED Course.    Chest pain Differential considered listed below  Atypical for ACS.  EKG without acute ischemic changes, dysrhythmias or blocks.  Serial troponins negative x 2.  Low pretest probability for pulmonary embolism.  Offered D-dimer but given recent negative dimer yesterday, patient declined.  Feel this is appropriate in this clinical picture since I have low suspicion for PE.  Presentation is not classic pleuritic dissection or esophageal perforation.  CBC with mild leukocytosis.  No anemia. Metabolic panel without significant electrolyte derangements or renal sufficiency.  Chest x-ray without evidence of pneumonia, pneumothorax, pulmonary edema pleural effusion.  Favors anxiety versus GI related process versus MSK.    Final Clinical Impression(s) / ED Diagnoses Final diagnoses:  Intermittent left-sided chest pain  Anxiety   The patient appears reasonably screened and/or stabilized for discharge and I doubt any other medical condition  or other [facility] requiring further screening, evaluation, or treatment in the ED at this time. I have discussed the findings, Dx and Tx plan with the patient/family who expressed understanding and agree(s) with the plan. Discharge instructions discussed at length. The patient/family was given strict return precautions who verbalized understanding of the instructions. No further questions at time of discharge.  Disposition: Discharge  Condition: Good  ED Discharge Orders     None        Follow Up: Chilton Greathouse, MD [address] [place] [address] [phone]  Call  to schedule an appointment for close follow up    This chart was dictated using voice recognition software.  Despite best efforts to proofread,  errors can occur which can change the documentation meaning.    Nira Conn, MD 12/22/22 [phone]

## 2022-12-29 ENCOUNTER — Other Ambulatory Visit (HOSPITAL_COMMUNITY): Payer: Self-pay

## 2022-12-29 MED ORDER — HYDROXYZINE PAMOATE 25 MG PO CAPS
25.0000 mg | ORAL_CAPSULE | Freq: Every evening | ORAL | 0 refills | Status: DC | PRN
  Filled 2022-12-29: qty 30, 8d supply, fill #0

## 2023-02-07 ENCOUNTER — Encounter: Payer: Self-pay | Admitting: Cardiology

## 2023-02-07 ENCOUNTER — Ambulatory Visit: Payer: BC Managed Care – PPO | Attending: Cardiology | Admitting: Cardiology

## 2023-02-07 VITALS — BP 102/68 | HR 96 | Resp 16 | Ht 65.0 in | Wt 145.6 lb

## 2023-02-07 DIAGNOSIS — E7879 Other disorders of bile acid and cholesterol metabolism: Secondary | ICD-10-CM

## 2023-02-07 DIAGNOSIS — R0789 Other chest pain: Secondary | ICD-10-CM | POA: Diagnosis not present

## 2023-02-07 DIAGNOSIS — F410 Panic disorder [episodic paroxysmal anxiety] without agoraphobia: Secondary | ICD-10-CM

## 2023-02-07 NOTE — Patient Instructions (Signed)
Medication Instructions:  Your physician recommends that you continue on your current medications as directed. Please refer to the Current Medication list given to you today.  *If you need a refill on your cardiac medications before your next appointment, please call your pharmacy*  Lab Work: If you have labs (blood work) drawn today and your tests are completely normal, you will receive your results only by: MyChart Message (if you have MyChart) OR A paper copy in the mail If you have any lab test that is abnormal or we need to change your treatment, we will call you to review the results.  Testing/Procedures: None ordered today.  Follow-Up: At [facility], you and your health needs are our priority.  As part of our continuing mission to provide you with exceptional heart care, we have created designated Provider Care Teams.  These Care Teams include your primary Cardiologist (physician) and Advanced Practice Providers (APPs -  Physician Assistants and Nurse Practitioners) who all work together to provide you with the care you need, when you need it.  We recommend signing up for the patient portal called "MyChart".  Sign up information is provided on this After Visit Summary.  MyChart is used to connect with patients for Virtual Visits (Telemedicine).  Patients are able to view lab/test results, encounter notes, upcoming appointments, etc.  Non-urgent messages can be sent to your provider as well.   To learn more about what you can do with MyChart, go to [email or web address].    Your next appointment:   As needed  Provider:   Yates Decamp, MD

## 2023-02-07 NOTE — Progress Notes (Signed)
Cardiology Office Note:  .   Date:  02/07/2023  ID:  Shannon Schaefer, DOB July 10, 1995, MRN [id number] PCP: Chilton Greathouse, MD  [facility] Providers Cardiologist:  Yates Decamp, MD    History of Present Illness: .   Shannon Schaefer is a 27 y.o. Caucasian female patient with history of panic attacks, familial hypercholesterolemia, seen in the emergency room twice recently last episode on 12/22/2022 referred to cardiology for further evaluation  Discussed the use of AI scribe software for clinical note transcription with the patient, who gave verbal consent to proceed.  History of Present Illness   The patient, with a history of high cholesterol and panic attacks, presents with concerns about chest pain and a previously noted right bundle branch block on EKG. The patient describes the panic attacks as "violent" with a sensation of tightness and pressure in the chest, but with a normal heart rate. The frequency of these attacks is inconsistent, with periods of six months with only one attack, and other periods with multiple attacks close together. The patient has not sought psychological treatment for these attacks but plans to do so.  The chest pain, described as a pressure and tightness, is located in the middle of the chest, high up. Occasionally, the patient experiences a sharp pain under the left breast, which she suspects could be heartburn. This discomfort usually occurs at night, often lasting for one to two hours. The patient manages these symptoms by trying to distract herself and by taking hydroxyzine, which has been effective in reducing the symptoms.  The patient also mentions a lifestyle that includes some physical activity, mainly walking, and a history of vaping. The patient has lost 45 pounds since January and is currently at a weight of 145 pounds, with a goal weight of 130 pounds. The patient does not smoke or drink alcohol regularly and does not have children but plans to in the  future.      Review of Systems  Cardiovascular:  Positive for chest pain and palpitations. Negative for dyspnea on exertion and leg swelling.  Neurological:  Positive for dizziness.    Risk Assessment/Calculations:     Lab Results  Component Value Date   NA 139 12/22/2022   K 4.0 12/22/2022   CO2 28 12/22/2022   GLUCOSE 87 12/22/2022   BUN 11 12/22/2022   CREATININE 0.69 12/22/2022   CALCIUM 10.6 (H) 12/22/2022   GFRNONAA >60 12/22/2022   Lab Results  Component Value Date   WBC 11.0 (H) 12/22/2022   HGB 15.9 (H) 12/22/2022   HCT 45.2 12/22/2022   MCV 89.0 12/22/2022   PLT 374 12/22/2022    External Labs:  Labs 05/06/2022:  Serum glucose 83 mg, BUN 11, creatinine 0.6, EGFR 162 mL, potassium 4.2, AST 39, normal.  ALT chronically elevated, 92.  Alkaline phosphatase normal at 71.  Hb 16.1/HCT 43.7, platelets 337, normal indices.  Total cholesterol 300, triglycerides 220, HDL 51, LDL 205.  Serum cortisol normal at 9.0.  TSH normal at 3.45.  Physical Exam:   VS:  BP 102/68 (BP Location: Left Arm, Patient Position: Sitting, Cuff Size: Normal)   Pulse 96   Resp 16   Ht 5\' 5"  (1.651 m)   Wt 145 lb 9.6 oz (66 kg)   SpO2 98%   BMI 24.23 kg/m    Wt Readings from Last 3 Encounters:  02/07/23 145 lb 9.6 oz (66 kg)  12/22/22 150 lb (68 kg)     Physical Exam  Neck:     Vascular: No carotid bruit or JVD.  Cardiovascular:     Rate and Rhythm: Normal rate and regular rhythm.     Pulses: Intact distal pulses.     Heart sounds: Normal heart sounds. No murmur heard.    No gallop.  Pulmonary:     Effort: Pulmonary effort is normal.     Breath sounds: Normal breath sounds.  Abdominal:     General: Bowel sounds are normal.     Palpations: Abdomen is soft.  Musculoskeletal:     Right lower leg: No edema.     Left lower leg: No edema.     Studies Reviewed: Marland Kitchen    EKG:    EKG Interpretation Date/Time:  Tuesday February 07 2023 13:33:20 EDT Ventricular Rate:  86 PR  Interval:  162 QRS Duration:  88 QT Interval:  368 QTC Calculation: 440 R Axis:   88  Text Interpretation: EKG 02/07/2023: Normal sinus rhythm with rate of 86 bpm, normal axis.  Incomplete right bundle branch block.  Normal EKG.  Compared to 12/22/2022, no significant change. Confirmed by Delrae Rend [phone]) on 02/07/2023 1:52:48 PM    ASSESSMENT AND PLAN: .      ICD-10-CM   1. Non-cardiac chest pain  R07.89 EKG 12-Lead    2. Panic attack  F41.0     3. 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 No current psychological treatment. Hydroxyzine provides relief. -Consider seeking psychological treatment for panic attacks. -Continue Hydroxyzine as needed.  Chest Pain Central, high up chest pressure, occasionally sharp pain under left breast, predominantly at night. Lasts 1-2 hours. Not associated with heart rate changes. Has correlation with panic attacks. -No further cardiac workup indicated at this time as symptoms are not suggestive of cardiac etiology.  Familial hyperlipidemia Fortunately no family history of premature coronary disease. Total cholesterol 300, LDL 205 as of January 2024. No family history of early heart disease. Patient is of childbearing age and planning to extend family. -Defer lipid-lowering therapy until completion of family planning. -Encourage smoking cessation for both patient and husband.  Incomplete Right Bundle Branch Block Noted on EKG. No associated symptoms.  Normal EKG.  I reassured her. -No specific treatment needed.  Weight Management Lost 45 pounds since January 2024. Current weight 145, goal weight 130-140. -Encourage maintenance of current weight, no need to reach 130.  Tobacco Use Daily vaping. -Strongly advise smoking cessation for both patient and husband, especially in light of hyperlipidemia.   Overall patient's symptoms of chest pain,  palpitations, or clearly not exertional and occur at rest, suspect noncardiac etiology.  I have reassured her.  She was also concerned about incomplete right bundle branch block and I reassured her stating that it is a normal EKG and a normal finding.  She was very pleased with the evaluation.  And as discussed above, would hold off on any lipid management at this time until she finishes her family-planning/childbearing age.  She plans to extend her family.  I have reviewed all her charts and external records as well as external labs.  Signed,  Yates Decamp, MD, [facility] 02/07/2023, 5:52 PM

## 2023-03-14 ENCOUNTER — Other Ambulatory Visit (HOSPITAL_COMMUNITY): Payer: Self-pay

## 2023-03-14 MED ORDER — HYDROXYZINE PAMOATE 25 MG PO CAPS
25.0000 mg | ORAL_CAPSULE | Freq: Every day | ORAL | 3 refills | Status: DC
  Filled 2023-03-14: qty 90, 30d supply, fill #0

## 2023-03-14 MED ORDER — ESCITALOPRAM OXALATE 20 MG PO TABS
20.0000 mg | ORAL_TABLET | Freq: Every day | ORAL | 3 refills | Status: DC
  Filled 2023-03-14: qty 30, 30d supply, fill #0
  Filled 2023-04-18: qty 90, 90d supply, fill #1
  Filled 2023-07-17: qty 90, 90d supply, fill #2
  Filled 2023-10-02: qty 30, 30d supply, fill #3
  Filled 2023-11-27: qty 30, 30d supply, fill #4
  Filled 2023-12-27: qty 90, 90d supply, fill #5

## 2023-04-18 ENCOUNTER — Other Ambulatory Visit (HOSPITAL_COMMUNITY): Payer: Self-pay

## 2023-05-22 ENCOUNTER — Other Ambulatory Visit (HOSPITAL_COMMUNITY): Payer: Self-pay

## 2023-05-22 MED ORDER — OSELTAMIVIR PHOSPHATE 75 MG PO CAPS
75.0000 mg | ORAL_CAPSULE | Freq: Two times a day (BID) | ORAL | 0 refills | Status: DC
  Filled 2023-05-22: qty 10, 5d supply, fill #0

## 2023-07-17 ENCOUNTER — Other Ambulatory Visit (HOSPITAL_COMMUNITY): Payer: Self-pay

## 2023-09-28 ENCOUNTER — Other Ambulatory Visit (HOSPITAL_COMMUNITY): Payer: Self-pay

## 2023-09-28 MED ORDER — GABAPENTIN 300 MG PO CAPS
300.0000 mg | ORAL_CAPSULE | Freq: Three times a day (TID) | ORAL | 1 refills | Status: DC
Start: 2023-09-28 — End: 2023-10-05
  Filled 2023-09-28: qty 90, 30d supply, fill #0

## 2023-09-28 MED ORDER — METHOCARBAMOL 750 MG PO TABS
750.0000 mg | ORAL_TABLET | Freq: Three times a day (TID) | ORAL | 1 refills | Status: DC
  Filled 2023-09-28: qty 90, 30d supply, fill #0

## 2023-09-28 MED ORDER — MELOXICAM 15 MG PO TABS
15.0000 mg | ORAL_TABLET | Freq: Every day | ORAL | 3 refills | Status: DC | PRN
  Filled 2023-09-28: qty 30, 30d supply, fill #0

## 2023-09-29 ENCOUNTER — Other Ambulatory Visit (HOSPITAL_COMMUNITY): Payer: Self-pay

## 2023-09-29 MED ORDER — OXYCODONE-ACETAMINOPHEN 5-325 MG PO TABS
1.0000 | ORAL_TABLET | Freq: Four times a day (QID) | ORAL | 0 refills | Status: DC | PRN
Start: 2023-09-29 — End: 2023-10-05
  Filled 2023-09-29: qty 40, 10d supply, fill #0

## 2023-10-02 ENCOUNTER — Encounter: Payer: Self-pay | Admitting: Emergency Medicine

## 2023-10-02 ENCOUNTER — Emergency Department
Admission: EM | Admit: 2023-10-02 | Discharge: 2023-10-02 | Disposition: A | Discharge status: Home / Self Care | Attending: Emergency Medicine | Admitting: Emergency Medicine

## 2023-10-02 ENCOUNTER — Other Ambulatory Visit: Payer: Self-pay

## 2023-10-02 ENCOUNTER — Other Ambulatory Visit (HOSPITAL_COMMUNITY): Payer: Self-pay

## 2023-10-02 ENCOUNTER — Emergency Department

## 2023-10-02 DIAGNOSIS — M545 Low back pain, unspecified: Secondary | ICD-10-CM

## 2023-10-02 DIAGNOSIS — M5126 Other intervertebral disc displacement, lumbar region: Secondary | ICD-10-CM | POA: Diagnosis not present

## 2023-10-02 MED ORDER — HYDROMORPHONE HCL 1 MG/ML IJ SOLN
1.0000 mg | Freq: Once | INTRAMUSCULAR | Status: AC
  Administered 2023-10-02: 1 mg via INTRAMUSCULAR
  Filled 2023-10-02: qty 1

## 2023-10-02 MED ORDER — HYDROMORPHONE HCL 1 MG/ML IJ SOLN
1.0000 mg | Freq: Once | INTRAMUSCULAR | Status: AC
  Administered 2023-10-02: 1 mg via INTRAMUSCULAR

## 2023-10-02 MED ORDER — KETOROLAC TROMETHAMINE 30 MG/ML IJ SOLN
30.0000 mg | Freq: Once | INTRAMUSCULAR | Status: AC
  Administered 2023-10-02: 30 mg via INTRAMUSCULAR
  Filled 2023-10-02: qty 1

## 2023-10-02 MED ORDER — PREDNISONE 20 MG PO TABS
60.0000 mg | ORAL_TABLET | Freq: Once | ORAL | Status: AC
  Administered 2023-10-02: 60 mg via ORAL
  Filled 2023-10-02: qty 3

## 2023-10-02 MED ORDER — PREDNISONE 50 MG PO TABS
50.0000 mg | ORAL_TABLET | Freq: Every day | ORAL | 0 refills | Status: DC
  Filled 2023-10-02: qty 5, 5d supply, fill #0

## 2023-10-02 MED ORDER — HYDROMORPHONE HCL 1 MG/ML IJ SOLN
1.0000 mg | Freq: Once | INTRAMUSCULAR | Status: DC
  Filled 2023-10-02: qty 1

## 2023-10-02 NOTE — ED Notes (Signed)
 Pt complains of lower back pain that is worse with movement. 9/10 .  States that her best position of comfort his being on her hand and knees to relive the pressure off her back.

## 2023-10-02 NOTE — ED Triage Notes (Signed)
 Pt in via POV, reports severe lower back pain due to ruptured disc at L5/S1.  Denies any new injuries but states it has been irritated since 5/9 and pain has progressed since.  Patient w/ difficulty standing, ambulating and sitting upright due to pain.

## 2023-10-02 NOTE — Discharge Instructions (Signed)
 Take the prednisone as prescribed starting tomorrow and finish the full 5-day course.  Continue taking your other pain medications as prescribed.  Make an appointment to follow-up with the neurosurgeon.  Return to the ER for new, worsening, or persistent severe pain, any weakness or numbness, urinary incontinence or retention, inability to hold your bowel movements, or any other new or worsening symptoms that concern you.

## 2023-10-02 NOTE — ED Provider Notes (Signed)
 [facility] Provider Note    Event Date/Time   First MD Initiated Contact with Patient 10/02/23 [phone]     (approximate)   History   Back Pain   HPI  Shannon Schaefer is a 28 y.o. female with a history of anxiety and high cholesterol who presents with an acute exacerbation of low back pain.  The patient states that she has had low back pain from a "ruptured" disc for the last few years.  Her most recent flareup was treated successfully with Toradol, gabapentin , and methocarbamol , but the patient has been taking these medications over the last few weeks without relief.  She has been prescribed Percocet as well with only minimal relief.  She reports increased tingling to her legs but no numbness.  She has difficulty ambulating due to pain but not weakness.  She denies any incontinence.  She has not had any trauma.  I reviewed the past medical records.  The patient's most recent MRI in our system from 2021 shows disc protrusion at L5-S1 compressing the right S1 nerve root.   Physical Exam   Triage Vital Signs: ED Triage Vitals  Encounter Vitals Group     BP 10/02/23 0039 124/79     Systolic BP Percentile --      Diastolic BP Percentile --      Pulse Rate 10/02/23 0039 99     Resp 10/02/23 0039 15     Temp 10/02/23 0039 98.1 F (36.7 C)     Temp Source 10/02/23 0039 Oral     SpO2 10/02/23 0039 100 %     Weight 10/02/23 0040 175 lb (79.4 kg)     Height 10/02/23 0040 5\' 5"  (1.651 m)     Head Circumference --      Peak Flow --      Pain Score 10/02/23 0040 8     Pain Loc --      Pain Education --      Exclude from Growth Chart --     Most recent vital signs: Vitals:   10/02/23 0039  BP: 124/79  Pulse: 99  Resp: 15  Temp: 98.1 F (36.7 C)  SpO2: 100%    General: Alert, uncomfortable appearing, no distress.  CV:  Good peripheral perfusion.  Resp:  Normal effort.  Abd:  No distention.  Other:  No midline spinal tenderness.  Right lumbosacral  paraspinal tenderness.  Motor and sensory intact to lower extremities.   ED Results / Procedures / Treatments   Labs (all labs ordered are listed, but only abnormal results are displayed) Labs Reviewed - No data to display   EKG     RADIOLOGY  MR lumbar spine:   IMPRESSION:  1. Right subarticular disc protrusion at L5-S1, impinging upon the  descending right S1 nerve root in the right lateral recess.  2. Otherwise normal MRI of the lumbar spine.     PROCEDURES:  Critical Care performed: No  Procedures   MEDICATIONS ORDERED IN ED: Medications  HYDROmorphone (DILAUDID) injection 1 mg (has no administration in time range)  ketorolac (TORADOL) 30 MG/ML injection 30 mg (30 mg Intramuscular Given 10/02/23 0131)  HYDROmorphone (DILAUDID) injection 1 mg (1 mg Intramuscular Given 10/02/23 0130)  predniSONE (DELTASONE) tablet 60 mg (60 mg Oral Given 10/02/23 0134)     IMPRESSION / MDM / ASSESSMENT AND PLAN / ED COURSE  I reviewed the triage vital signs and the nursing notes.  28 year old female with PMH as noted  above presents with worsening lower back pain.  The patient has a history of an L5-S1 disc protrusion that was diagnosed several years ago.  She reports that this is the worst flareup that she has had has not been relieved by a medication regimen which has helped previously.  She has increased tingling to the legs but no numbness or weakness.  Differential diagnosis includes, but is not limited to, lumbosacral disc protrusion, herniation, rupture, less likely compression fracture.  I have a low suspicion for cauda equina syndrome given the lack of weakness or numbness.  However given the severe pain and escalating symptoms I think it would be advisable to obtain an MRI to determine whether she may need any acute intervention.  Patient's presentation is most consistent with acute complicated illness / injury requiring diagnostic  workup.  ----------------------------------------- 6:08 AM on 10/02/2023 -----------------------------------------  MRI was disc protrusion at L5-S1 compressing the right S1 nerve root, very similar reading to her MRI from 2021.  Given the lack of any numbness or weakness, there is no indication for emergent neurosurgery consultation.  The patient's pain was markedly improved with the initial pain medications and steroid.  I decided to observe the patient for several hours to monitor for recurrence of the pain, and plan for likely a second dose in the ED prior to sending her home.  She has now been in the ED for over 5 hours and the pain is starting to return.  I ordered a second dose of Dilaudid.  At this time, the patient is stable for discharge.  She will continue Percocet, methocarbamol , and gabapentin  which she already has.  I have added on prednisone.  She has follow-up in late July with an outside neurosurgeon, but wanted referral here, which I have provided.  I counseled her on the results of the workup and gave strict return precautions; she expressed understanding.   FINAL CLINICAL IMPRESSION(S) / ED DIAGNOSES   Final diagnoses:  Chronic low back pain without sciatica, unspecified back pain laterality  Protruded lumbar disc     Rx / DC Orders   ED Discharge Orders          Ordered    predniSONE (DELTASONE) 50 MG tablet  Daily        10/02/23 0607             Note:  This document was prepared using Dragon voice recognition software and may include unintentional dictation errors.    Lind Repine, MD 10/02/23 [phone]

## 2023-10-04 NOTE — Progress Notes (Unsigned)
 Referring Physician:  Avva, Ravisankar, MD [address] [place],  [place] [address]  Primary Physician:  Avva, Ravisankar, MD  History of Present Illness: 10/05/2023 [name] has a history of obesity, hypercholesterolemia, panic anxiety syndrome, sinus arrhythmia.   Seen in ED on 10/02/23 with acute LBP (saw Elsner in the past). 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 Previous pain improved with injections. Unable to get appointment with Dr. Ellery Guthrie until 11/29/23.   She has constant LBP with right posterior leg pain to her foot that started 09/08/23. She has weakness in right leg along with intermittent tingling. Some relief with dilaudid. No other alleviating factors. Pain is worse with everything.   She has been bed bound for last month due to pain.   She is taking percocet, robaxin , and neurontin . Prednisone added by ED on 10/02/23.   Bowel/Bladder Dysfunction: none  Conservative measures:  Physical therapy: has not participated in recently Multimodal medical therapy including regular antiinflammatories:  toradol, gabapentin , robaxin , percocet,  Injections:  Previous ESIs with Dr. Ellery Guthrie in 2021 with relief   Past Surgery: no spinal surgeries  Shannon Schaefer has no symptoms of cervical myelopathy.  The symptoms are causing a significant impact on the patient's life.   Review of Systems:  A 10 point review of systems is negative, except for the pertinent positives and negatives detailed in the HPI.  Past Medical History: Past Medical History:  Diagnosis Date   Anxiety    Chest pain    Hyperlipidemia    Obesity    RBBB    Sinus arrhythmia     Past Surgical History: Past Surgical History:  Procedure Laterality Date   RHINOPLASTY      Allergies: Allergies as of 10/05/2023   (No Known Allergies)    Medications: Outpatient Encounter Medications as of 10/05/2023  Medication Sig   escitalopram  (LEXAPRO ) 20 MG tablet Take 1 tablet (20 mg total) by mouth daily.   gabapentin   (NEURONTIN ) 300 MG capsule Take 1 capsule (300 mg total) by mouth 3 (three) times daily.   hydrOXYzine  (VISTARIL ) 25 MG capsule Take 1 capsule (25 mg total) by mouth at bedtime. Up to three times daily as needed   melatonin 1 MG TABS tablet Take 1 mg by mouth at bedtime.   meloxicam  (MOBIC ) 15 MG tablet Take 1 tablet (15 mg total) by mouth daily as needed for back pain.   methocarbamol  (ROBAXIN ) 750 MG tablet Take 1 tablet (750 mg total) by mouth 3 (three) times daily.   oxyCODONE -acetaminophen  (PERCOCET/ROXICET) 5-325 MG tablet Take 1 tablet by mouth every 6 (six) hours as needed.   predniSONE (DELTASONE) 50 MG tablet Take 1 tablet (50 mg total) by mouth daily at 12 noon for 5 days.   Semaglutide ,0.25 or 0.5MG /DOS, (OZEMPIC , 0.25 OR 0.5 MG/DOSE,) 2 MG/3ML SOPN Inject 0.25 mg into the skin weekly for 2 weeks and then inject 0.5mg  weekly as directed   Semaglutide -Weight Management (WEGOVY ) 0.5 MG/0.5ML SOAJ Inject 0.5 mg into the skin once a week.   [DISCONTINUED] escitalopram  (LEXAPRO ) 20 MG tablet Take 20 mg by mouth daily.   [DISCONTINUED] hydrOXYzine  (VISTARIL ) 25 MG capsule Take 1 capsule (25 mg total) by mouth at bedtime as needed, may take up to every 6 hours if needed   [DISCONTINUED] levonorgestrel  (MIRENA , 52 MG,) 20 MCG/DAY IUD 1 each by Intrauterine route once.   [DISCONTINUED] oseltamivir  (TAMIFLU ) 75 MG capsule Take 1 capsule (75 mg total) by mouth 2 (two) times daily for 5 days as  directed   No facility-administered encounter medications on file as of 10/05/2023.    Social History: Social History   Tobacco Use   Smoking status: Never   Smokeless tobacco: Never  Vaping Use   Vaping status: Every Day   Substances: Nicotine, Flavoring  Substance Use Topics   Alcohol use: Yes   Drug use: No    Family Medical History: Family History  Problem Relation Age of Onset   Hyperlipidemia Mother    Thyroid  disease Mother    Mood Disorder Mother    Raynaud syndrome Mother     Osteoporosis Mother    Hypertension Father    Hyperlipidemia Father     Physical Examination: Vitals:   10/05/23 1043  BP: 112/76    General: Patient is well developed, well nourished, calm, collected, and in no apparent distress. Attention to examination is appropriate.  Respiratory: Patient is breathing without any difficulty.  She is very uncomfortable in the exam room due to pain.    NEUROLOGICAL:     Awake, alert, oriented to person, place, and time.  Speech is clear and fluent. Fund of knowledge is appropriate.   Cranial Nerves: Pupils equal round and reactive to light.  Facial tone is symmetric.    No posterior lumbar tenderness.   No abnormal lesions on exposed skin.   Strength: Side Biceps Triceps Deltoid Interossei Grip Wrist Ext. Wrist Flex.  R 5 5 5 5 5 5 5   L 5 5 5 5 5 5 5    Side Iliopsoas Quads Hamstring PF DF EHL  R 5 5 5  4- 4- 4-  L 5 5 5 5 5 5    She has difficulty standing on heels on right side. She cannot stand on toes on her right foot, she can toe/heel stand on left foot.   Reflexes are 2+ and symmetric at the biceps, brachioradialis, patella and achilles.   Hoffman's is absent.   1-2 beats clonus in bilateral lower extremities.   Bilateral upper and lower extremity sensation is intact to light touch, but diminished in right.   Abnormal gait- she has difficulty walking due to pain.   Medical Decision Making  Imaging: Lumbar MRI dated 10/02/23:  FINDINGS: Segmentation: Standard. Lowest well-formed disc space labeled the L5-S1 level.   Alignment: Trace degenerative retrolisthesis of L5 on S1. Alignment otherwise normal preservation of the normal lumbar lordosis.   Vertebrae: Vertebral body height maintained without acute or chronic fracture. Bone marrow signal intensity within normal limits. No discrete or worrisome osseous lesions. Minimal reactive endplate changes present about the L5-S1 interspace. No other abnormal marrow edema.    Conus medullaris and cauda equina: Conus extends to the L1-2 level. Conus and cauda equina appear normal.   Paraspinal and other soft tissues: Unremarkable.   Disc levels:   Lumbar spine is image from the T12-L1 level inferiorly. No significant findings are seen through the L4-5 level.   L5-S1: Disc desiccation with mild diffuse disc bulge. Superimposed right subarticular disc protrusion impinges upon the descending right S1 nerve root in the right lateral recess (series 9, image 31). Resultant moderate right lateral recess stenosis. Central canal remains patent. No significant foraminal stenosis. Overall, appearance is similar as compared to previous MRI from 12/03/2019.   IMPRESSION: 1. Right subarticular disc protrusion at L5-S1, impinging upon the descending right S1 nerve root in the right lateral recess. 2. Otherwise normal MRI of the lumbar spine.     Electronically Signed   By: Elenor Griffith.D.  On: 10/02/2023 02:35  I have personally reviewed the images and agree with the above interpretation.  Assessment and Plan: Ms. Das has history of right L5-S1 HNP with pain that improved after injections in 2021.   Now with 4 week history of severe constant right sided LBP/buttock pain with right posterior leg pain to her foot. She has tingling and weakness in right leg. She has been bed bound due to pain.   She has known right sided disc L5-S1 impinging on right S1 nerve.   Treatment options discussed with patient and following plan made:   - She has weakness in right foot with severe pain in her back and right leg.  - Dr. Mont Antis discussed options with her. Due to severe pain and weakness, surgery was recommended, specifically a right L5-S1 microdiscectomy.  - Will plan to do this later today.   I spent a total of 45 minutes in face-to-face and non-face-to-face activities related to this patient's care today including review of outside records, review of  imaging, review of symptoms, physical exam, discussion of differential diagnosis, discussion of treatment options, and documentation.   Thank you for involving me in the care of this patient.   Lucetta Russel PA-C Dept. of Neurosurgery   This is an addendum to above.  Given her worsening symptoms and objective weakness on evaluation, Lucetta Russel asked me to evaluate the patient.  On my examination, I confirmed the weakness shown in [name]'s exam.  She has a right S1 radiculopathy due to severe compression of the right S1 nerve root in the lateral recess.  She has objective weakness on evaluation.  She has been unable to get out of bed for the past 4 weeks.  Given her objective weakness, further conservative management is not indicated.  I have recommended surgical intervention with a right sided L5-S1 microdiscectomy.  We will do this on an urgent basis given her objective weakness and worsening condition.  She is on semaglutide .  We would normally hold this for 7 days prior to an elective procedure.  As this is not an elective situation, I feel that the risks of waiting and potentially causing permanent neurologic dysfunction are outweighed by the risks of proceeding.  As such, I recommended proceeding today.  Additionally, she last ate around 9 AM.  We will proceed prior to the 8-hour window being completely over as this is an urgent situation.  I discussed the planned procedure at length with the patient, including the risks, benefits, alternatives, and indications. The risks discussed include but are not limited to bleeding, infection, need for reoperation, spinal fluid leak, stroke, vision loss, anesthetic complication, coma, paralysis, and even death. I also described in detail that improvement was not guaranteed.  The patient expressed understanding of these risks, and asked that we proceed with surgery. I described the surgery in layman's terms, and gave ample opportunity for questions, which  were answered to the best of my ability.

## 2023-10-04 NOTE — H&P (View-Only) (Signed)
 Referring Physician:  Avva, Ravisankar, MD [address] [place],  [place] [address]  Primary Physician:  Avva, Ravisankar, MD  History of Present Illness: 10/05/2023 [name] has a history of obesity, hypercholesterolemia, panic anxiety syndrome, sinus arrhythmia.   Seen in ED on 10/02/23 with acute LBP (saw Shannon Schaefer in the past). 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 Previous pain improved with injections. Unable to get appointment with Dr. Ellery Schaefer until 11/29/23.   She has constant LBP with right posterior leg pain to her foot that started 09/08/23. She has weakness in right leg along with intermittent tingling. Some relief with dilaudid. No other alleviating factors. Pain is worse with everything.   She has been bed bound for last month due to pain.   She is taking percocet, robaxin , and neurontin . Prednisone added by ED on 10/02/23.   Bowel/Bladder Dysfunction: none  Conservative measures:  Physical therapy: has not participated in recently Multimodal medical therapy including regular antiinflammatories:  toradol, gabapentin , robaxin , percocet,  Injections:  Previous ESIs with Dr. Ellery Schaefer in 2021 with relief   Past Surgery: no spinal surgeries  Shannon Schaefer has no symptoms of cervical myelopathy.  The symptoms are causing a significant impact on the patient's life.   Review of Systems:  A 10 point review of systems is negative, except for the pertinent positives and negatives detailed in the HPI.  Past Medical History: Past Medical History:  Diagnosis Date   Anxiety    Chest pain    Hyperlipidemia    Obesity    RBBB    Sinus arrhythmia     Past Surgical History: Past Surgical History:  Procedure Laterality Date   RHINOPLASTY      Allergies: Allergies as of 10/05/2023   (No Known Allergies)    Medications: Outpatient Encounter Medications as of 10/05/2023  Medication Sig   escitalopram  (LEXAPRO ) 20 MG tablet Take 1 tablet (20 mg total) by mouth daily.   gabapentin   (NEURONTIN ) 300 MG capsule Take 1 capsule (300 mg total) by mouth 3 (three) times daily.   hydrOXYzine  (VISTARIL ) 25 MG capsule Take 1 capsule (25 mg total) by mouth at bedtime. Up to three times daily as needed   melatonin 1 MG TABS tablet Take 1 mg by mouth at bedtime.   meloxicam  (MOBIC ) 15 MG tablet Take 1 tablet (15 mg total) by mouth daily as needed for back pain.   methocarbamol  (ROBAXIN ) 750 MG tablet Take 1 tablet (750 mg total) by mouth 3 (three) times daily.   oxyCODONE -acetaminophen  (PERCOCET/ROXICET) 5-325 MG tablet Take 1 tablet by mouth every 6 (six) hours as needed.   predniSONE (DELTASONE) 50 MG tablet Take 1 tablet (50 mg total) by mouth daily at 12 noon for 5 days.   Semaglutide ,0.25 or 0.5MG /DOS, (OZEMPIC , 0.25 OR 0.5 MG/DOSE,) 2 MG/3ML SOPN Inject 0.25 mg into the skin weekly for 2 weeks and then inject 0.5mg  weekly as directed   Semaglutide -Weight Management (WEGOVY ) 0.5 MG/0.5ML SOAJ Inject 0.5 mg into the skin once a week.   [DISCONTINUED] escitalopram  (LEXAPRO ) 20 MG tablet Take 20 mg by mouth daily.   [DISCONTINUED] hydrOXYzine  (VISTARIL ) 25 MG capsule Take 1 capsule (25 mg total) by mouth at bedtime as needed, may take up to every 6 hours if needed   [DISCONTINUED] levonorgestrel  (MIRENA , 52 MG,) 20 MCG/DAY IUD 1 each by Intrauterine route once.   [DISCONTINUED] oseltamivir  (TAMIFLU ) 75 MG capsule Take 1 capsule (75 mg total) by mouth 2 (two) times daily for 5 days as  directed   No facility-administered encounter medications on file as of 10/05/2023.    Social History: Social History   Tobacco Use   Smoking status: Never   Smokeless tobacco: Never  Vaping Use   Vaping status: Every Day   Substances: Nicotine, Flavoring  Substance Use Topics   Alcohol use: Yes   Drug use: No    Family Medical History: Family History  Problem Relation Age of Onset   Hyperlipidemia Mother    Thyroid  disease Mother    Mood Disorder Mother    Raynaud syndrome Mother     Osteoporosis Mother    Hypertension Father    Hyperlipidemia Father     Physical Examination: Vitals:   10/05/23 1043  BP: 112/76    General: Patient is well developed, well nourished, calm, collected, and in no apparent distress. Attention to examination is appropriate.  Respiratory: Patient is breathing without any difficulty.  She is very uncomfortable in the exam room due to pain.    NEUROLOGICAL:     Awake, alert, oriented to person, place, and time.  Speech is clear and fluent. Fund of knowledge is appropriate.   Cranial Nerves: Pupils equal round and reactive to light.  Facial tone is symmetric.    No posterior lumbar tenderness.   No abnormal lesions on exposed skin.   Strength: Side Biceps Triceps Deltoid Interossei Grip Wrist Ext. Wrist Flex.  R 5 5 5 5 5 5 5   L 5 5 5 5 5 5 5    Side Iliopsoas Quads Hamstring PF DF EHL  R 5 5 5  4- 4- 4-  L 5 5 5 5 5 5    She has difficulty standing on heels on right side. She cannot stand on toes on her right foot, she can toe/heel stand on left foot.   Reflexes are 2+ and symmetric at the biceps, brachioradialis, patella and achilles.   Hoffman's is absent.   1-2 beats clonus in bilateral lower extremities.   Bilateral upper and lower extremity sensation is intact to light touch, but diminished in right.   Abnormal gait- she has difficulty walking due to pain.   Medical Decision Making  Imaging: Lumbar MRI dated 10/02/23:  FINDINGS: Segmentation: Standard. Lowest well-formed disc space labeled the L5-S1 level.   Alignment: Trace degenerative retrolisthesis of L5 on S1. Alignment otherwise normal preservation of the normal lumbar lordosis.   Vertebrae: Vertebral body height maintained without acute or chronic fracture. Bone marrow signal intensity within normal limits. No discrete or worrisome osseous lesions. Minimal reactive endplate changes present about the L5-S1 interspace. No other abnormal marrow edema.    Conus medullaris and cauda equina: Conus extends to the L1-2 level. Conus and cauda equina appear normal.   Paraspinal and other soft tissues: Unremarkable.   Disc levels:   Lumbar spine is image from the T12-L1 level inferiorly. No significant findings are seen through the L4-5 level.   L5-S1: Disc desiccation with mild diffuse disc bulge. Superimposed right subarticular disc protrusion impinges upon the descending right S1 nerve root in the right lateral recess (series 9, image 31). Resultant moderate right lateral recess stenosis. Central canal remains patent. No significant foraminal stenosis. Overall, appearance is similar as compared to previous MRI from 12/03/2019.   IMPRESSION: 1. Right subarticular disc protrusion at L5-S1, impinging upon the descending right S1 nerve root in the right lateral recess. 2. Otherwise normal MRI of the lumbar spine.     Electronically Signed   By: Elenor Griffith.D.  On: 10/02/2023 02:35  I have personally reviewed the images and agree with the above interpretation.  Assessment and Plan: Ms. Kuehl has history of right L5-S1 HNP with pain that improved after injections in 2021.   Now with 4 week history of severe constant right sided LBP/buttock pain with right posterior leg pain to her foot. She has tingling and weakness in right leg. She has been bed bound due to pain.   She has known right sided disc L5-S1 impinging on right S1 nerve.   Treatment options discussed with patient and following plan made:   - She has weakness in right foot with severe pain in her back and right leg.  - Dr. Mont Antis discussed options with her. Due to severe pain and weakness, surgery was recommended, specifically a right L5-S1 microdiscectomy.  - Will plan to do this later today.   I spent a total of 45 minutes in face-to-face and non-face-to-face activities related to this patient's care today including review of outside records, review of  imaging, review of symptoms, physical exam, discussion of differential diagnosis, discussion of treatment options, and documentation.   Thank you for involving me in the care of this patient.   Lucetta Russel PA-C Dept. of Neurosurgery   This is an addendum to above.  Given her worsening symptoms and objective weakness on evaluation, Lucetta Russel asked me to evaluate the patient.  On my examination, I confirmed the weakness shown in Ms. Arlynn Mcdermid's exam.  She has a right S1 radiculopathy due to severe compression of the right S1 nerve root in the lateral recess.  She has objective weakness on evaluation.  She has been unable to get out of bed for the past 4 weeks.  Given her objective weakness, further conservative management is not indicated.  I have recommended surgical intervention with a right sided L5-S1 microdiscectomy.  We will do this on an urgent basis given her objective weakness and worsening condition.  She is on semaglutide .  We would normally hold this for 7 days prior to an elective procedure.  As this is not an elective situation, I feel that the risks of waiting and potentially causing permanent neurologic dysfunction are outweighed by the risks of proceeding.  As such, I recommended proceeding today.  Additionally, she last ate around 9 AM.  We will proceed prior to the 8-hour window being completely over as this is an urgent situation.  I discussed the planned procedure at length with the patient, including the risks, benefits, alternatives, and indications. The risks discussed include but are not limited to bleeding, infection, need for reoperation, spinal fluid leak, stroke, vision loss, anesthetic complication, coma, paralysis, and even death. I also described in detail that improvement was not guaranteed.  The patient expressed understanding of these risks, and asked that we proceed with surgery. I described the surgery in layman's terms, and gave ample opportunity for questions, which  were answered to the best of my ability.

## 2023-10-05 ENCOUNTER — Encounter: Payer: Self-pay | Admitting: Orthopedic Surgery

## 2023-10-05 ENCOUNTER — Ambulatory Visit
Admission: RE | Admit: 2023-10-05 | Discharge: 2023-10-05 | Disposition: A | Discharge status: Home / Self Care | Source: Ambulatory Visit | Attending: Neurosurgery | Admitting: Neurosurgery

## 2023-10-05 ENCOUNTER — Ambulatory Visit: Admitting: Urgent Care

## 2023-10-05 ENCOUNTER — Inpatient Hospital Stay: Admission: RE | Admit: 2023-10-05 | Source: Ambulatory Visit

## 2023-10-05 ENCOUNTER — Other Ambulatory Visit: Payer: Self-pay

## 2023-10-05 ENCOUNTER — Ambulatory Visit

## 2023-10-05 ENCOUNTER — Ambulatory Visit: Admitting: Orthopedic Surgery

## 2023-10-05 ENCOUNTER — Encounter: Payer: Self-pay | Admitting: Neurosurgery

## 2023-10-05 ENCOUNTER — Encounter
Admission: RE | Disposition: A | Discharge status: Home / Self Care | Payer: Self-pay | Source: Ambulatory Visit | Attending: Neurosurgery

## 2023-10-05 VITALS — BP 112/76 | Ht 65.0 in | Wt 175.0 lb

## 2023-10-05 DIAGNOSIS — R29898 Other symptoms and signs involving the musculoskeletal system: Secondary | ICD-10-CM

## 2023-10-05 DIAGNOSIS — Z7401 Bed confinement status: Secondary | ICD-10-CM | POA: Insufficient documentation

## 2023-10-05 DIAGNOSIS — Z79899 Other long term (current) drug therapy: Secondary | ICD-10-CM | POA: Insufficient documentation

## 2023-10-05 DIAGNOSIS — Z6829 Body mass index (BMI) 29.0-29.9, adult: Secondary | ICD-10-CM | POA: Diagnosis not present

## 2023-10-05 DIAGNOSIS — M47816 Spondylosis without myelopathy or radiculopathy, lumbar region: Secondary | ICD-10-CM

## 2023-10-05 DIAGNOSIS — E669 Obesity, unspecified: Secondary | ICD-10-CM | POA: Insufficient documentation

## 2023-10-05 DIAGNOSIS — Z01818 Encounter for other preprocedural examination: Secondary | ICD-10-CM

## 2023-10-05 DIAGNOSIS — M5127 Other intervertebral disc displacement, lumbosacral region: Secondary | ICD-10-CM | POA: Diagnosis not present

## 2023-10-05 DIAGNOSIS — F410 Panic disorder [episodic paroxysmal anxiety] without agoraphobia: Secondary | ICD-10-CM | POA: Diagnosis not present

## 2023-10-05 DIAGNOSIS — M5416 Radiculopathy, lumbar region: Secondary | ICD-10-CM | POA: Diagnosis present

## 2023-10-05 DIAGNOSIS — M4726 Other spondylosis with radiculopathy, lumbar region: Secondary | ICD-10-CM

## 2023-10-05 DIAGNOSIS — M5126 Other intervertebral disc displacement, lumbar region: Secondary | ICD-10-CM | POA: Diagnosis present

## 2023-10-05 DIAGNOSIS — M5116 Intervertebral disc disorders with radiculopathy, lumbar region: Secondary | ICD-10-CM | POA: Diagnosis not present

## 2023-10-05 DIAGNOSIS — E785 Hyperlipidemia, unspecified: Secondary | ICD-10-CM

## 2023-10-05 DIAGNOSIS — Z01812 Encounter for preprocedural laboratory examination: Secondary | ICD-10-CM

## 2023-10-05 HISTORY — DX: Other intervertebral disc displacement, lumbar region: M51.26

## 2023-10-05 HISTORY — PX: LUMBAR LAMINECTOMY/DECOMPRESSION MICRODISCECTOMY: SHX5026

## 2023-10-05 LAB — CBC
HCT: 43.9 % (ref 36.0–46.0)
Hemoglobin: 15.4 g/dL — ABNORMAL HIGH (ref 12.0–15.0)
MCH: 32 pg (ref 26.0–34.0)
MCHC: 35.1 g/dL (ref 30.0–36.0)
MCV: 91.1 fL (ref 80.0–100.0)
Platelets: 330 10*3/uL (ref 150–400)
RBC: 4.82 MIL/uL (ref 3.87–5.11)
RDW: 11.9 % (ref 11.5–15.5)
WBC: 12.1 10*3/uL — ABNORMAL HIGH (ref 4.0–10.5)
nRBC: 0 % (ref 0.0–0.2)

## 2023-10-05 LAB — URINALYSIS, COMPLETE (UACMP) WITH MICROSCOPIC
Bilirubin Urine: NEGATIVE
Glucose, UA: NEGATIVE mg/dL
Hgb urine dipstick: NEGATIVE
Ketones, ur: NEGATIVE mg/dL
Nitrite: NEGATIVE
Protein, ur: NEGATIVE mg/dL
Specific Gravity, Urine: 1.023 (ref 1.005–1.030)
pH: 5 (ref 5.0–8.0)

## 2023-10-05 LAB — SURGICAL PCR SCREEN
MRSA, PCR: NEGATIVE
Staphylococcus aureus: NEGATIVE

## 2023-10-05 LAB — POCT PREGNANCY, URINE: Preg Test, Ur: NEGATIVE

## 2023-10-05 LAB — SAMPLE TO BLOOD BANK

## 2023-10-05 SURGERY — LUMBAR LAMINECTOMY/DECOMPRESSION MICRODISCECTOMY 1 LEVEL
Anesthesia: General | Laterality: Right

## 2023-10-05 MED ORDER — CHLORHEXIDINE GLUCONATE 0.12 % MT SOLN
15.0000 mL | Freq: Once | OROMUCOSAL | Status: AC
  Administered 2023-10-05: 15 mL via OROMUCOSAL

## 2023-10-05 MED ORDER — SUCCINYLCHOLINE CHLORIDE 200 MG/10ML IV SOSY
PREFILLED_SYRINGE | INTRAVENOUS | Status: DC | PRN
  Administered 2023-10-05: 100 mg via INTRAVENOUS

## 2023-10-05 MED ORDER — BUPIVACAINE HCL (PF) 0.5 % IJ SOLN
INTRAMUSCULAR | Status: AC
  Filled 2023-10-05: qty 30

## 2023-10-05 MED ORDER — ONDANSETRON HCL 4 MG/2ML IJ SOLN
INTRAMUSCULAR | Status: DC | PRN
Start: 2023-10-05 — End: 2023-10-05
  Administered 2023-10-05: 4 mg via INTRAVENOUS

## 2023-10-05 MED ORDER — KETAMINE HCL 50 MG/5ML IJ SOSY
PREFILLED_SYRINGE | INTRAMUSCULAR | Status: AC
  Filled 2023-10-05: qty 5

## 2023-10-05 MED ORDER — PROPOFOL 1000 MG/100ML IV EMUL
INTRAVENOUS | Status: AC
  Filled 2023-10-05: qty 100

## 2023-10-05 MED ORDER — PHENYLEPHRINE HCL-NACL 20-0.9 MG/250ML-% IV SOLN
INTRAVENOUS | Status: AC
  Filled 2023-10-05: qty 250

## 2023-10-05 MED ORDER — OXYCODONE HCL 5 MG PO TABS: 5.0000 mg | ORAL_TABLET | ORAL | 0 refills | Status: AC | PRN

## 2023-10-05 MED ORDER — CHLORHEXIDINE GLUCONATE 0.12 % MT SOLN
OROMUCOSAL | Status: AC
  Filled 2023-10-05: qty 15

## 2023-10-05 MED ORDER — GENTAMICIN SULFATE 40 MG/ML IJ SOLN
100.0000 mg | Freq: Once | INTRAVENOUS | Status: AC
  Administered 2023-10-05: 100 mg via INTRAVENOUS
  Filled 2023-10-05: qty 2.5

## 2023-10-05 MED ORDER — 0.9 % SODIUM CHLORIDE (POUR BTL) OPTIME
TOPICAL | Status: DC | PRN
  Administered 2023-10-05: 500 mL

## 2023-10-05 MED ORDER — CEFAZOLIN IN SODIUM CHLORIDE 2-0.9 GM/100ML-% IV SOLN
2.0000 g | Freq: Once | INTRAVENOUS | Status: AC
  Administered 2023-10-05: 2 g via INTRAVENOUS
  Filled 2023-10-05: qty 100

## 2023-10-05 MED ORDER — SURGIFLO WITH THROMBIN (HEMOSTATIC MATRIX KIT) OPTIME
TOPICAL | Status: DC | PRN
  Administered 2023-10-05: 1 via TOPICAL

## 2023-10-05 MED ORDER — OXYCODONE HCL 5 MG/5ML PO SOLN: 5.0000 mg | Freq: Once | ORAL | Status: AC | PRN

## 2023-10-05 MED ORDER — FENTANYL CITRATE (PF) 100 MCG/2ML IJ SOLN
INTRAMUSCULAR | Status: AC
  Filled 2023-10-05: qty 2

## 2023-10-05 MED ORDER — LACTATED RINGERS IV SOLN: INTRAVENOUS | Status: DC

## 2023-10-05 MED ORDER — BUPIVACAINE-EPINEPHRINE (PF) 0.5% -1:200000 IJ SOLN
INTRAMUSCULAR | Status: DC | PRN
  Administered 2023-10-05: 10 mL

## 2023-10-05 MED ORDER — REMIFENTANIL HCL 1 MG IV SOLR
INTRAVENOUS | Status: DC | PRN
  Administered 2023-10-05: .05 ug/kg/min via INTRAVENOUS

## 2023-10-05 MED ORDER — REMIFENTANIL HCL 1 MG IV SOLR
INTRAVENOUS | Status: AC
  Filled 2023-10-05: qty 1000

## 2023-10-05 MED ORDER — METHOCARBAMOL 500 MG PO TABS
ORAL_TABLET | ORAL | Status: AC
  Filled 2023-10-05: qty 1

## 2023-10-05 MED ORDER — MIDAZOLAM HCL 2 MG/2ML IJ SOLN
INTRAMUSCULAR | Status: AC
  Filled 2023-10-05: qty 2

## 2023-10-05 MED ORDER — PROPOFOL 500 MG/50ML IV EMUL
INTRAVENOUS | Status: DC | PRN
  Administered 2023-10-05: 125 ug/kg/min via INTRAVENOUS

## 2023-10-05 MED ORDER — ACETAMINOPHEN 10 MG/ML IV SOLN
INTRAVENOUS | Status: AC
  Filled 2023-10-05: qty 100

## 2023-10-05 MED ORDER — KETAMINE HCL 10 MG/ML IJ SOLN
INTRAMUSCULAR | Status: DC | PRN
  Administered 2023-10-05: 20 mg via INTRAVENOUS
  Administered 2023-10-05: 10 mg via INTRAVENOUS

## 2023-10-05 MED ORDER — METHYLPREDNISOLONE ACETATE 40 MG/ML IJ SUSP
INTRAMUSCULAR | Status: DC | PRN
  Administered 2023-10-05: 40 mg

## 2023-10-05 MED ORDER — PROPOFOL 10 MG/ML IV BOLUS
INTRAVENOUS | Status: DC | PRN
Start: 2023-10-05 — End: 2023-10-05
  Administered 2023-10-05: 200 mg via INTRAVENOUS

## 2023-10-05 MED ORDER — FENTANYL CITRATE (PF) 100 MCG/2ML IJ SOLN
25.0000 ug | INTRAMUSCULAR | Status: DC | PRN
  Administered 2023-10-05 (×2): 50 ug via INTRAVENOUS

## 2023-10-05 MED ORDER — METHOCARBAMOL 500 MG PO TABS
500.0000 mg | ORAL_TABLET | Freq: Three times a day (TID) | ORAL | Status: DC
  Administered 2023-10-05: 500 mg via ORAL

## 2023-10-05 MED ORDER — FENTANYL CITRATE (PF) 100 MCG/2ML IJ SOLN
INTRAMUSCULAR | Status: DC | PRN
  Administered 2023-10-05 (×2): 50 ug via INTRAVENOUS

## 2023-10-05 MED ORDER — DEXAMETHASONE SODIUM PHOSPHATE 10 MG/ML IJ SOLN
INTRAMUSCULAR | Status: DC | PRN
  Administered 2023-10-05: 10 mg via INTRAVENOUS

## 2023-10-05 MED ORDER — OXYCODONE HCL 5 MG PO TABS
ORAL_TABLET | ORAL | Status: AC
  Filled 2023-10-05: qty 1

## 2023-10-05 MED ORDER — MIDAZOLAM HCL 2 MG/2ML IJ SOLN
INTRAMUSCULAR | Status: DC | PRN
  Administered 2023-10-05: 2 mg via INTRAVENOUS

## 2023-10-05 MED ORDER — METHYLPREDNISOLONE ACETATE 40 MG/ML IJ SUSP
INTRAMUSCULAR | Status: AC
  Filled 2023-10-05: qty 1

## 2023-10-05 MED ORDER — OXYCODONE HCL 5 MG PO TABS
5.0000 mg | ORAL_TABLET | Freq: Once | ORAL | Status: AC | PRN
  Administered 2023-10-05: 5 mg via ORAL

## 2023-10-05 MED ORDER — LIDOCAINE HCL (PF) 2 % IJ SOLN
INTRAMUSCULAR | Status: DC | PRN
  Administered 2023-10-05: 100 mg via INTRADERMAL

## 2023-10-05 MED ORDER — METHOCARBAMOL 500 MG PO TABS
500.0000 mg | ORAL_TABLET | Freq: Four times a day (QID) | ORAL | 0 refills | Status: DC | PRN
Start: 2023-10-05 — End: 2023-11-17

## 2023-10-05 MED ORDER — BUPIVACAINE HCL (PF) 0.5 % IJ SOLN
INTRAMUSCULAR | Status: DC | PRN
  Administered 2023-10-05: 20 mL

## 2023-10-05 MED ORDER — EPHEDRINE SULFATE-NACL 50-0.9 MG/10ML-% IV SOSY
PREFILLED_SYRINGE | INTRAVENOUS | Status: DC | PRN
Start: 2023-10-05 — End: 2023-10-05
  Administered 2023-10-05: 5 mg via INTRAVENOUS

## 2023-10-05 MED ORDER — ACETAMINOPHEN 10 MG/ML IV SOLN
INTRAVENOUS | Status: DC | PRN
Start: 2023-10-05 — End: 2023-10-05
  Administered 2023-10-05: 1000 mg via INTRAVENOUS

## 2023-10-05 MED ORDER — SODIUM CHLORIDE (PF) 0.9 % IJ SOLN
INTRAMUSCULAR | Status: AC
  Filled 2023-10-05: qty 20

## 2023-10-05 MED ORDER — CEFAZOLIN SODIUM-DEXTROSE 2-4 GM/100ML-% IV SOLN
INTRAVENOUS | Status: AC
  Filled 2023-10-05: qty 100

## 2023-10-05 MED ORDER — ORAL CARE MOUTH RINSE: 15.0000 mL | Freq: Once | OROMUCOSAL | Status: AC

## 2023-10-05 MED ORDER — DEXMEDETOMIDINE HCL IN NACL 80 MCG/20ML IV SOLN
INTRAVENOUS | Status: DC | PRN
Start: 2023-10-05 — End: 2023-10-05
  Administered 2023-10-05: 8 ug via INTRAVENOUS

## 2023-10-05 MED ORDER — SODIUM CHLORIDE 0.9 % IV SOLN
INTRAVENOUS | Status: DC | PRN
  Administered 2023-10-05: 40 mL

## 2023-10-05 SURGICAL SUPPLY — 31 items
BASIN KIT SINGLE STR (MISCELLANEOUS) ×2 IMPLANT
BUR NEURO DRILL SOFT 3.0X3.8M (BURR) ×2 IMPLANT
DERMABOND ADVANCED .7 DNX12 (GAUZE/BANDAGES/DRESSINGS) ×2 IMPLANT
DRAPE C ARM PK CFD 31 SPINE (DRAPES) ×2 IMPLANT
DRAPE LAPAROTOMY 100X77 ABD (DRAPES) ×2 IMPLANT
DRAPE MICROSCOPE SPINE 48X150 (DRAPES) ×2 IMPLANT
DRSG OPSITE POSTOP 3X4 (GAUZE/BANDAGES/DRESSINGS) ×2 IMPLANT
ELECTRODE EZSTD 165MM 6.5IN (MISCELLANEOUS) ×2 IMPLANT
ELECTRODE REM PT RTRN 9FT ADLT (ELECTROSURGICAL) ×2 IMPLANT
GLOVE BIOGEL PI IND STRL 6.5 (GLOVE) ×2 IMPLANT
GLOVE SURG SYN 6.5 ES PF (GLOVE) ×1 IMPLANT
GLOVE SURG SYN 6.5 PF PI (GLOVE) ×2 IMPLANT
GLOVE SURG SYN 8.5 E (GLOVE) ×3 IMPLANT
GLOVE SURG SYN 8.5 PF PI (GLOVE) ×6 IMPLANT
GOWN SRG LRG LVL 4 IMPRV REINF (GOWNS) ×2 IMPLANT
GOWN SRG XL LVL 3 NONREINFORCE (GOWNS) ×2 IMPLANT
KIT SPINAL PRONEVIEW (KITS) ×2 IMPLANT
MANIFOLD NEPTUNE II (INSTRUMENTS) ×2 IMPLANT
MARKER SKIN DUAL TIP RULER LAB (MISCELLANEOUS) ×2 IMPLANT
NDL SAFETY ECLIPSE 18X1.5 (NEEDLE) ×2 IMPLANT
NS IRRIG 500ML POUR BTL (IV SOLUTION) ×2 IMPLANT
PACK LAMINECTOMY ARMC (PACKS) ×2 IMPLANT
PAD ARMBOARD POSITIONER FOAM (MISCELLANEOUS) ×2 IMPLANT
SURGIFLO W/THROMBIN 8M KIT (HEMOSTASIS) ×2 IMPLANT
SUT STRATA 3-0 15 PS-2 (SUTURE) ×2 IMPLANT
SUT VIC AB 0 CT1 27XCR 8 STRN (SUTURE) ×2 IMPLANT
SUT VIC AB 2-0 CT1 18 (SUTURE) ×2 IMPLANT
SYR 30ML LL (SYRINGE) ×4 IMPLANT
SYR 3ML LL SCALE MARK (SYRINGE) ×2 IMPLANT
TRAP FLUID SMOKE EVACUATOR (MISCELLANEOUS) ×2 IMPLANT
WATER STERILE IRR 500ML POUR (IV SOLUTION) IMPLANT

## 2023-10-05 NOTE — Progress Notes (Signed)
 Patient tolerated po fluids, crackers while in pacu. Ambulated to bathroom with one assist, did well, voided approx . Medicated for pain.

## 2023-10-05 NOTE — Addendum Note (Signed)
 Addended by: Rebacca Votaw on: 10/05/2023 11:49 AM   Modules accepted: Orders

## 2023-10-05 NOTE — Interval H&P Note (Signed)
 History and Physical Interval Note:  10/05/2023 4:33 PM In addition to my prior addendum, please see the addendum to Stacy Luna's note from earlier today.  This patient has presented with worsening pain and weakness. Due to her worsening neurologic deficit, I have declared that this case is an emergency and that proceeding without waiting for 7 days off semaglutide  or 8 hours NPO is appropriate and worth the small increase risk that may be incurred.  The patient and her husband are in agreement.   Shannon Schaefer

## 2023-10-05 NOTE — Patient Instructions (Signed)
  Please see below for information in regards to your upcoming surgery:   Planned surgery: right L5-S1 microdiscectomy   Surgery date: 10/05/2023 at [facility]: [address] [place], [place] [address])   Common restrictions after surgery: No bending, lifting, or twisting ("BLT"). Avoid lifting objects heavier than 10 pounds for the first 6 weeks after surgery. Where possible, avoid household activities that involve lifting, bending, reaching, pushing, or pulling such as laundry, vacuuming, grocery shopping, and childcare. Try to arrange for help from friends and family for these activities while you heal. Do not drive while taking prescription pain medication. Weeks 6 through 12 after surgery: avoid lifting more than 25 pounds.     How to contact us :  If you have any questions/concerns before or after surgery, you can reach us  at [phone], or you can send a mychart message. We can be reached by phone or mychart 8am-4pm, Monday-Friday.  *Please note: Calls after 4pm are forwarded to a third party answering service. Mychart messages are not routinely monitored during evenings, weekends, and holidays. Please call our office to contact the answering service for urgent concerns during non-business hours.    If you have FMLA/disability paperwork, please drop it off or fax it to [phone], attention Patty.   Appointments/FMLA & disability paperwork: Gerlean Kocher, & Maryann Smalls Registered Nurses/Surgery schedulers: Jolly Carlini & Lauren Medical Assistants: Donnajean Fuse Physician Assistants: Ludwig Safer, PA-C, Anastacio Karvonen, PA-C & Lucetta Russel, PA-C Surgeons: Jodeen Munch, MD & Henderson Lock, MD

## 2023-10-05 NOTE — Interval H&P Note (Signed)
 History and Physical Interval Note:  10/05/2023 4:24 PM  Shannon Schaefer  has presented today for surgery, with the diagnosis of M54.16 lumbar radiculopathy R29.898 Weakness of foot, right.  The various methods of treatment have been discussed with the patient and family. After consideration of risks, benefits and other options for treatment, the patient has consented to  Procedure(s) with comments: LUMBAR LAMINECTOMY/DECOMPRESSION MICRODISCECTOMY 1 LEVEL (Right) - RIGHT L5-S1 MICRODISCECTOMY as a surgical intervention.  The patient's history has been reviewed, patient examined, no change in status, stable for surgery.  I have reviewed the patient's chart and labs.  Questions were answered to the patient's satisfaction.    Heart sounds normal no MRG. Chest Clear to Auscultation Bilaterally.   Wilgus Deyton

## 2023-10-05 NOTE — Discharge Instructions (Addendum)
 Your surgeon has performed an operation on your lumbar spine (low back) to relieve pressure on one or more nerves. Many times, patients feel better immediately after surgery and can "overdo it." Even if you feel well, it is important that you follow these activity guidelines. If you do not let your back heal properly from the surgery, you can increase the chance of a disc herniation and/or return of your symptoms. The following are instructions to help in your recovery once you have been discharged from the hospital.  * It is ok to take NSAIDs after surgery. *Regarding compression stockings-  Please wear day and night until you are walking a couple hundred feet three times a day.   Activity    No bending, lifting, or twisting ("BLT"). Avoid lifting objects heavier than 10 pounds (gallon milk jug).  Where possible, avoid household activities that involve lifting, bending, pushing, or pulling such as laundry, vacuuming, grocery shopping, and childcare. Try to arrange for help from friends and family for these activities while your back heals.  Increase physical activity slowly as tolerated.  Taking short walks is encouraged, but avoid strenuous exercise. Do not jog, run, bicycle, lift weights, or participate in any other exercises unless specifically allowed by your doctor. Avoid prolonged sitting, including car rides.  Talk to your doctor before resuming sexual activity.  You should not drive until cleared by your doctor.  Until released by your doctor, you should not return to work or school.  You should rest at home and let your body heal.   You may shower one days after your surgery.  After showering, lightly dab your incision/dressing dry. Do not take a tub bath or go swimming for 3 weeks, or until approved by your doctor at your follow-up appointment.  If you smoke, we strongly recommend that you quit.  Smoking has been proven to interfere with normal healing in your back and will  dramatically reduce the success rate of your surgery. Please contact [facility] (800-QUIT-NOW) and use the resources at www.[facility].com for assistance in stopping smoking.  Surgical Incision   If you have a dressing on your incision, you may remove it three days after your surgery. Keep your incision area clean and dry.  If you have staples or stitches on your incision, you should have a follow up scheduled for removal. If you do not have staples or stitches, you will have steri-strips (small pieces of surgical tape) or Dermabond glue. The steri-strips/glue should begin to peel away within about a week (it is fine if the steri-strips fall off before then). If the strips are still in place one week after your surgery, you may gently remove them.  Diet            You may return to your usual diet. Be sure to stay hydrated.  When to Contact Us   Although your surgery and recovery will likely be uneventful, you may have some residual numbness, aches, and pains in your back and/or legs. This is normal and should improve in the next few weeks.  However, should you experience any of the following, contact us  immediately: New numbness or weakness Pain that is progressively getting worse, and is not relieved by your pain medications or rest Bleeding, redness, swelling, pain, or drainage from surgical incision Chills or flu-like symptoms Fever greater than 101.0 F (38.3 C) Problems with bowel or bladder functions Difficulty breathing or shortness of breath Warmth, tenderness, or swelling in your calf  Contact Information How  to contact us :  If you have any questions/concerns before or after surgery, you can reach us  at [phone], or you can send a mychart message. We can be reached by phone or mychart 8am-4pm, Monday-Friday.  *Please note: Calls after 4pm are forwarded to a third party answering service. Mychart messages are not routinely monitored during evenings, weekends, and holidays.  Please call our office to contact the answering service for urgent concerns during non-business hours.

## 2023-10-05 NOTE — Anesthesia Preprocedure Evaluation (Addendum)
 Anesthesia Evaluation  Patient identified by MRN, date of birth, ID band Patient awake    Reviewed: Allergy & Precautions, NPO status , Patient's Chart, lab work & pertinent test results  Airway Mallampati: II  TM Distance: >3 FB Neck ROM: full    Dental  (+) Chipped   Pulmonary neg pulmonary ROS   Pulmonary exam normal        Cardiovascular Normal cardiovascular exam+ dysrhythmias      Neuro/Psych  PSYCHIATRIC DISORDERS Anxiety     negative neurological ROS     GI/Hepatic negative GI ROS, Neg liver ROS,,,  Endo/Other  negative endocrine ROS    Renal/GU negative Renal ROS  negative genitourinary   Musculoskeletal   Abdominal   Peds  Hematology negative hematology ROS (+)   Anesthesia Other Findings Patient took her GLP-1 2 days ago and ate 7 hours ago. Pregnancy test is still pending. Surgeon has declared the case emergent and we will proceed. Patient is at increased risk for aspiration and damage to her heart, brain, lungs. Patient is also at risk of losing pregnancy if she is pregnant. Patient stated she is aware and willing to proceed.    Past Medical History: No date: Anxiety No date: Chest pain No date: HNP (herniated nucleus pulposus), lumbar No date: Hyperlipidemia No date: Insomnia No date: Obesity No date: Panic attacks No date: RBBB No date: Sinus arrhythmia  Past Surgical History: No date: RHINOPLASTY  BMI    Body Mass Index: 29.12 kg/m      Reproductive/Obstetrics negative OB ROS                             Anesthesia Physical Anesthesia Plan  ASA: 2 and emergent  Anesthesia Plan: General ETT   Post-op Pain Management:    Induction: Intravenous and Rapid sequence  PONV Risk Score and Plan: 3 and Ondansetron, Dexamethasone and Midazolam  Airway Management Planned: Oral ETT  Additional Equipment:   Intra-op Plan:   Post-operative Plan: Extubation in  OR  Informed Consent: I have reviewed the patients History and Physical, chart, labs and discussed the procedure including the risks, benefits and alternatives for the proposed anesthesia with the patient or authorized representative who has indicated his/her understanding and acceptance.     Dental Advisory Given  Plan Discussed with: Anesthesiologist, CRNA and Surgeon  Anesthesia Plan Comments: (Patient consented for risks of anesthesia including but not limited to:  - adverse reactions to medications - damage to eyes, teeth, lips or other oral mucosa - nerve damage due to positioning  - sore throat or hoarseness - Damage to heart, brain, nerves, lungs, other parts of body or loss of life  Patient voiced understanding and assent.)       Anesthesia Quick Evaluation

## 2023-10-05 NOTE — Discharge Summary (Signed)
 Physician Discharge Summary  Patient ID: Shannon Schaefer MRN: [id number] DOB/AGE: 1995-10-31 28 y.o.  Admit date: 10/05/2023 Discharge date: 10/05/2023  Admission Diagnoses: Active Problems:   Lumbar radiculopathy   Right leg weakness   Herniated nucleus pulposus, lumbar  Discharge Diagnoses:  Active Problems:   Lumbar radiculopathy   Right leg weakness   Herniated nucleus pulposus, lumbar   Discharged Condition: good  Hospital Course: [name] was admitted with a severe right lumbar radiculopathy causing worsening weakness and pain.  She was counseled that surgery was appropriate.  Due to severe difficulty walking and worsening symptoms, I recommended urgent intervention.  Consults: None  Significant Diagnostic Studies: radiology: X-Ray: localization  Treatments: surgery: R L5/S1 microdiscectomy  Discharge Exam: Blood pressure 121/87, pulse 90, temperature 97.7 F (36.5 C), temperature source Temporal, resp. rate 16, height 5\' 5"  (1.651 m), weight 79.4 kg, last menstrual period 05/11/2023, SpO2 98%. General appearance: alert and cooperative CNI MAEW  Disposition: Discharge disposition: 01-Home or Self Care       Discharge Instructions     Discharge patient   Complete by: As directed    Discharge disposition: 01-Home or Self Care   Discharge patient date: 10/05/2023      Allergies as of 10/05/2023   No Known Allergies      Medication List     STOP taking these medications    gabapentin  300 MG capsule Commonly known as: NEURONTIN    oxyCODONE -acetaminophen  5-325 MG tablet Commonly known as: PERCOCET/ROXICET   predniSONE 50 MG tablet Commonly known as: DELTASONE   Wegovy  0.5 MG/0.5ML Soaj Generic drug: Semaglutide -Weight Management       TAKE these medications    escitalopram  20 MG tablet Commonly known as: LEXAPRO  Take 1 tablet (20 mg total) by mouth daily.   hydrOXYzine  25 MG capsule Commonly known as: VISTARIL  Take 1 capsule (25 mg  total) by mouth at bedtime. Up to three times daily as needed   melatonin 1 MG Tabs tablet Take 1 mg by mouth at bedtime.   meloxicam  15 MG tablet Commonly known as: MOBIC  Take 1 tablet (15 mg total) by mouth daily as needed for back pain.   methocarbamol  500 MG tablet Commonly known as: ROBAXIN  Take 1 tablet (500 mg total) by mouth every 6 (six) hours as needed for muscle spasms. What changed:  medication strength how much to take when to take this reasons to take this   oxyCODONE  5 MG immediate release tablet Commonly known as: Roxicodone  Take 1 tablet (5 mg total) by mouth every 4 (four) hours as needed for up to 7 days for moderate pain (pain score 4-6).   Ozempic  (0.25 or 0.5 MG/DOSE) 2 MG/3ML Sopn Generic drug: Semaglutide (0.25 or 0.5MG /DOS) Inject 0.25 mg into the skin weekly for 2 weeks and then inject 0.5mg  weekly as directed         Signed: Jodeen Munch 10/05/2023, 6:43 PM

## 2023-10-05 NOTE — Op Note (Signed)
 Indications: [name] is suffering from lumbar radiculopathy and presented with worsening pain and weakness. Due to worsening weakness, surgical intervention was recommended.  Findings: disc herniation with compression of R S1 nerve root  Preoperative Diagnosis: Lumbar radiculopathy (ICD-10 M54.16), right leg weakness Postoperative Diagnosis: same   EBL: 10 ml IVF: see anesthesia record Drains: none Disposition: Extubated and Stable to PACU Complications: none  No foley catheter was placed.   Preoperative Note:   Risks of surgery discussed include: infection, bleeding, stroke, coma, death, paralysis, CSF leak, nerve/spinal cord injury, numbness, tingling, weakness, complex regional pain syndrome, recurrent stenosis and/or disc herniation, vascular injury, development of instability, neck/back pain, need for further surgery, persistent symptoms, development of deformity, and the risks of anesthesia. The patient understood these risks and agreed to proceed.  Operative Note:   1) right L5/S1 microdiscectomy  The patient was then brought from the preoperative center with intravenous access established.  The patient underwent general anesthesia and endotracheal tube intubation, and was then rotated on the [place] rail top where all pressure points were appropriately padded.  The skin was then thoroughly cleansed.  Perioperative antibiotic prophylaxis was administered.  Sterile prep and drapes were then applied and a timeout was then observed.  C-arm was brought into the field under sterile conditions, and the L5-S1 disc space identified and marked with an incision on the right 1cm lateral to midline.  Once this was complete a 2 cm incision was opened with the use of a #10 blade knife.  The Metrx tubes were sequentially advanced under lateral fluoroscopy until a 18 x 60 mm Metrx tube was placed over the facet and lamina and secured to the bed.    The microscope was then sterilely  brought into the field and muscle creep was hemostased with a bipolar and resected with a pituitary rongeur.  A Bovie extender was then used to expose the spinous process and lamina.  Careful attention was placed to not violate the facet capsule. A 3 mm matchstick drill bit was then used to make a hemi-laminotomy trough until the ligamentum flavum was exposed.  This was extended to the base of the spinous process.  Once this was complete and the underlying ligamentum flavum was visualized, the ligamentum was dissected with an up angle curette and resected with a #2 and #3 mm biting Kerrison.  The laminotomy opening was also expanded in similar fashion and hemostasis was obtained with Surgifoam and a patty as well as bone wax.  The rostral aspect of the caudal level of the lamina was also resected with a #2 biting Kerrison effort to further enhance exposure.  Once the underlying dura was visualized a Penfield 4 was then used to dissect and expose the traversing nerve root.  Once this was identified a nerve root retractor suction was used to mobilize this medially.  The venous plexus was hemostased with Surgifoam and light bipolar use.  A small penfied was then used to make a small annulotomy within the disc space and disc space contents were noted to come through the annulus.    The disc herniation was identified compressing the right S1 nerve root. It was dissected free using a balltip probe. The pituitary rongeur was used to remove the extruded disc fragments. Once the thecal sac and nerve root were noted to be relaxed and under less tension the ball-tipped feeler was passed along the foramen distally to ensure no residual compression was noted.    Depo-Medrol was placed along  the nerve root.  The area was irrigated. The tube system was then removed under microscopic visualization and hemostasis was obtained with a bipolar.    The fascial layer was reapproximated with the use of a 0- Vicryl suture.   Subcutaneous tissue layer was reapproximated using 2-0 Vicryl suture.  3-0 monocryl was used on the skin. The skin was then cleansed and Dermabond was used to close the skin opening.  Patient was then rotated back to the preoperative bed awakened from anesthesia and taken to recovery all counts are correct in this case.   I performed the entire procedure. Due to the urgent nature of the surgery, no assistant was utilized.   Jodeen Munch MD

## 2023-10-05 NOTE — Transfer of Care (Signed)
 Immediate Anesthesia Transfer of Care Note  Patient: Shannon Schaefer  Procedure(s) Performed: LUMBAR LAMINECTOMY/DECOMPRESSION MICRODISCECTOMY 1 LEVEL (Right)  Patient Location: PACU  Anesthesia Type:General  Level of Consciousness: awake, alert , and oriented  Airway & Oxygen Therapy: Patient Spontanous Breathing  Post-op Assessment: Report given to RN and Post -op Vital signs reviewed and stable  Post vital signs: Reviewed  Last Vitals:  Vitals Value Taken Time  BP 126/79   Temp    Pulse 89 10/05/23 1834  Resp 34 10/05/23 1834  SpO2 100 % 10/05/23 1834  Vitals shown include unfiled device data.  Last Pain:         Complications: No notable events documented.

## 2023-10-05 NOTE — Anesthesia Procedure Notes (Signed)
 Procedure Name: Intubation Date/Time: 10/05/2023 5:15 PM  Performed by: Curvin Downing, CRNAPre-anesthesia Checklist: Patient identified, Emergency Drugs available, Suction available and Patient being monitored Patient Re-evaluated:Patient Re-evaluated prior to induction Oxygen Delivery Method: Circle system utilized Preoxygenation: Pre-oxygenation with 100% oxygen Induction Type: IV induction and Rapid sequence Laryngoscope Size: McGrath and 3 Grade View: Grade I Tube type: Oral Tube size: 7.0 mm Number of attempts: 1 Airway Equipment and Method: Stylet and Oral airway Placement Confirmation: ETT inserted through vocal cords under direct vision, positive ETCO2 and breath sounds checked- equal and bilateral Secured at: 22 cm Tube secured with: Tape Dental Injury: Teeth and Oropharynx as per pre-operative assessment

## 2023-10-06 ENCOUNTER — Telehealth: Payer: Self-pay

## 2023-10-06 ENCOUNTER — Encounter: Payer: Self-pay | Admitting: Neurosurgery

## 2023-10-06 NOTE — Telephone Encounter (Signed)
 I spoke with Shannon Schaefer. We discussed post op restrictions and how to contact us  if she needs something after hours. I provided her with her 3 post op appointments. She reports she is feeling much better today.

## 2023-10-09 NOTE — Anesthesia Postprocedure Evaluation (Signed)
 Anesthesia Post Note  Patient: GRISELA MESCH  Procedure(s) Performed: LUMBAR LAMINECTOMY/DECOMPRESSION MICRODISCECTOMY 1 LEVEL (Right)  Patient location during evaluation: PACU Anesthesia Type: General Level of consciousness: awake and alert Pain management: pain level controlled Vital Signs Assessment: post-procedure vital signs reviewed and stable Respiratory status: spontaneous breathing, nonlabored ventilation, respiratory function stable and patient connected to nasal cannula oxygen Cardiovascular status: blood pressure returned to baseline and stable Postop Assessment: no apparent nausea or vomiting Anesthetic complications: no   No notable events documented.   Last Vitals:  Vitals:   10/05/23 1900 10/05/23 1912  BP: 110/77   Pulse: 82 64  Resp: 16 20  Temp: 36.5 C   SpO2: 99% 96%    Last Pain:  Vitals:   10/05/23 1912  TempSrc:   PainSc: 5                  Enrique Harvest

## 2023-10-13 ENCOUNTER — Telehealth: Payer: Self-pay | Admitting: Neurosurgery

## 2023-10-13 NOTE — Telephone Encounter (Signed)
 Patient has called stating she would need a leave note for work regarding surgery. Would like to pick this up as soon as she can.

## 2023-10-13 NOTE — Telephone Encounter (Signed)
 Patient plans to be out of work until 11/16/2023. She will call us  if she feels ready before this date. Letter has been printed for pick up on Monday.

## 2023-10-13 NOTE — Progress Notes (Unsigned)
   REFERRING PHYSICIAN:  Avva, Ravisankar, Md [address] [place],  [place] [address]  DOS: 10/05/23  Right L5-S1 microdiscectomy  HISTORY OF PRESENT ILLNESS: Shannon Schaefer is approximately 2 weeks status post above surgery. Was given robaxin  and oxycodone  on discharge from the hospital.   She is doing much better! Her right leg pain was gone after the surgery. She notes some intermittent right leg/buttock pain in last day or so. No numbness, tingling, or weakness.   She is taking prn oxycodone  and prn robaxin . Needs refill of oxycodone .    PHYSICAL EXAMINATION:  General: Patient is well developed, well nourished, calm, collected, and in no apparent distress.   NEUROLOGICAL:  General: In no acute distress.   Awake, alert, oriented to person, place, and time.  Pupils equal round and reactive to light.  Facial tone is symmetric.     Strength:            Side Iliopsoas Quads Hamstring PF DF EHL  R 5 5 5 5 5 5   L 5 5 5 5 5 5    Incision c/d/i   ROS (Neurologic):  Negative except as noted above  IMAGING: Nothing new to review.   ASSESSMENT/PLAN:  Shannon Schaefer is doing well s/p above surgery. Treatment options reviewed with patient and following plan made:   - I have advised the patient to lift up to 10 pounds until 6 weeks after surgery (follow up with Dr. Mont Antis).  - Reviewed wound care.  - No bending, twisting, or lifting.  - Continue on current medications including prn oxycodone  and prn robaxin .  - Refill of oxycodone  given. PMP reviewed and is appropriate.   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 FMLA papers filled out. Will let us  know if she is ready to return prior to her follow up.  - Follow up as scheduled in 4 weeks and prn.   Advised to contact the office if any questions or concerns arise.  Lucetta Russel PA-C Department of neurosurgery

## 2023-10-19 ENCOUNTER — Other Ambulatory Visit (HOSPITAL_COMMUNITY): Payer: Self-pay

## 2023-10-19 ENCOUNTER — Encounter: Payer: Self-pay | Admitting: Orthopedic Surgery

## 2023-10-19 ENCOUNTER — Ambulatory Visit (INDEPENDENT_AMBULATORY_CARE_PROVIDER_SITE_OTHER): Admitting: Orthopedic Surgery

## 2023-10-19 VITALS — BP 122/76 | Temp 98.3°F

## 2023-10-19 DIAGNOSIS — M5127 Other intervertebral disc displacement, lumbosacral region: Secondary | ICD-10-CM

## 2023-10-19 DIAGNOSIS — M5126 Other intervertebral disc displacement, lumbar region: Secondary | ICD-10-CM

## 2023-10-19 DIAGNOSIS — Z9889 Other specified postprocedural states: Secondary | ICD-10-CM

## 2023-10-19 MED ORDER — OXYCODONE HCL 5 MG PO CAPS
5.0000 mg | ORAL_CAPSULE | Freq: Four times a day (QID) | ORAL | 0 refills | Status: DC | PRN
  Filled 2023-10-19: qty 20, 5d supply, fill #0

## 2023-10-19 MED ORDER — OXYCODONE HCL 5 MG PO TABS
5.0000 mg | ORAL_TABLET | Freq: Four times a day (QID) | ORAL | 0 refills | Status: DC | PRN
  Filled 2023-10-19: qty 20, 5d supply, fill #0

## 2023-10-24 ENCOUNTER — Encounter: Payer: Self-pay | Admitting: Neurosurgery

## 2023-11-16 ENCOUNTER — Encounter: Admitting: Neurosurgery

## 2023-11-17 ENCOUNTER — Encounter: Payer: Self-pay | Admitting: Neurosurgery

## 2023-11-17 ENCOUNTER — Ambulatory Visit (INDEPENDENT_AMBULATORY_CARE_PROVIDER_SITE_OTHER): Admitting: Neurosurgery

## 2023-11-17 VITALS — BP 116/82 | Temp 97.9°F | Ht 65.0 in | Wt 165.0 lb

## 2023-11-17 DIAGNOSIS — Z09 Encounter for follow-up examination after completed treatment for conditions other than malignant neoplasm: Secondary | ICD-10-CM

## 2023-11-17 DIAGNOSIS — M5127 Other intervertebral disc displacement, lumbosacral region: Secondary | ICD-10-CM

## 2023-11-17 DIAGNOSIS — M5126 Other intervertebral disc displacement, lumbar region: Secondary | ICD-10-CM

## 2023-11-17 DIAGNOSIS — Z9889 Other specified postprocedural states: Secondary | ICD-10-CM

## 2023-11-17 NOTE — Progress Notes (Signed)
   REFERRING PHYSICIAN:  Avva, Ravisankar, Md [address] [place],  [place] [address]  DOS: 10/05/23  Right L5-S1 microdiscectomy  HISTORY OF PRESENT ILLNESS: Shannon Schaefer is status post above surgery.  She is doing extremely well.  She has occasional pain down her right leg.      PHYSICAL EXAMINATION:  General: Patient is well developed, well nourished, calm, collected, and in no apparent distress.   NEUROLOGICAL:  General: In no acute distress.   Awake, alert, oriented to person, place, and time.  Pupils equal round and reactive to light.  Facial tone is symmetric.     Strength:            Side Iliopsoas Quads Hamstring PF DF EHL  R 5 5 5  4+ 5 5  L 5 5 5 5 5 5    Incision c/d/i   ROS (Neurologic):  Negative except as noted above  IMAGING: Nothing new to review.   ASSESSMENT/PLAN:  Shannon Schaefer is doing well s/p above surgery.  She has had a near complete recovery.  I suspect her right plantarflexion weakness will improve with time.  We discussed activity restrictions.  Will see her back in clinic in 6 weeks.  She will return to work next week.    Reeves Daisy MD Department of neurosurgery

## 2023-11-27 ENCOUNTER — Other Ambulatory Visit (HOSPITAL_COMMUNITY): Payer: Self-pay

## 2023-12-22 NOTE — Progress Notes (Deleted)
   REFERRING PHYSICIAN:  Janey Santos, Md [address] [place],  [place] [address]  DOS: 10/05/23  Right L5-S1 microdiscectomy  HISTORY OF PRESENT ILLNESS:  She was doing very well at her last visit with Dr. Clois with only occasional right leg pain. She was to return back to work a week after her visit.          She is doing much better! Her right leg pain was gone after the surgery. She notes some intermittent right leg/buttock pain in last day or so. No numbness, tingling, or weakness.   She is taking prn oxycodone  and prn robaxin . Needs refill of oxycodone .    PHYSICAL EXAMINATION:  General: Patient is well developed, well nourished, calm, collected, and in no apparent distress.   NEUROLOGICAL:  General: In no acute distress.   Awake, alert, oriented to person, place, and time.  Pupils equal round and reactive to light.  Facial tone is symmetric.     Strength:            Side Iliopsoas Quads Hamstring PF DF EHL  R 5 5 5 5 5 5   L 5 5 5 5 5 5    Incision well healed.    ROS (Neurologic):  Negative except as noted above  IMAGING: Nothing new to review.   ASSESSMENT/PLAN:  Shannon Schaefer is doing well s/p above surgery. Treatment options reviewed with patient and following plan made:   - She can return back to activity as tolerated.  - She will f/u prn.   Advised to contact the office if any questions or concerns arise.  Glade Boys PA-C Department of neurosurgery

## 2023-12-26 ENCOUNTER — Encounter: Payer: Self-pay | Admitting: Orthopedic Surgery

## 2023-12-26 DIAGNOSIS — Z9889 Other specified postprocedural states: Secondary | ICD-10-CM

## 2023-12-26 DIAGNOSIS — M5126 Other intervertebral disc displacement, lumbar region: Secondary | ICD-10-CM

## 2023-12-27 ENCOUNTER — Other Ambulatory Visit (HOSPITAL_COMMUNITY): Payer: Self-pay

## 2023-12-28 ENCOUNTER — Telehealth: Payer: Self-pay

## 2023-12-28 NOTE — Telephone Encounter (Signed)
 12/28/23: I called [employer] and spoke with [facility]. I was informed that the member still does not have surgery benefits. Also, her policy terminated 11/25/23. She does not show where they were ever activated between her surgery date (10/05/23) and the date of termination. [place] advised calling another company, [employer], to see if the member has benefits with them. Ref# [id number].    I called [facility] [phone]) and spoke with Khiari. Per Rosealee Domino has accident benefits with them that cover $300 for the ER and $10,000 for a qualifying event. Her member ID is [id number]. She advised that Jaretzy give them a call to provide details/start the process of a claim, as she did have coverage at the time of her surgery.  Attempted to reach the patient. Left message for her to return my call.

## 2024-01-04 ENCOUNTER — Encounter: Payer: Self-pay | Admitting: Orthopedic Surgery

## 2024-01-15 NOTE — Telephone Encounter (Signed)
 Spoke with pt's spouse and discussed recent phone call to [place]. He states he has spoken with [employer] (about 15 times) and [employer] recently as well. [employer] told him to contact [employer]. [employer] told him to contact [employer].   He has submitted proof that he activated coverage for surgery benefits to [facility] and they told him they will look into it. They have also told him various alternative answers each time he has called and he feels like he is getting the run-around.   He has also been in touch with HR through the employer.   I encouraged him to consider contacting the [facility] insurance commissioner to see if they can assist. I also informed him that I am happy to assist in any way. He will reach out to me if there is any way I can help.

## 2024-02-15 ENCOUNTER — Other Ambulatory Visit (HOSPITAL_COMMUNITY): Payer: Self-pay

## 2024-02-15 MED ORDER — FLUCONAZOLE 150 MG PO TABS
150.0000 mg | ORAL_TABLET | ORAL | 0 refills | Status: DC
  Filled 2024-02-15: qty 2, 2d supply, fill #0

## 2024-03-22 ENCOUNTER — Other Ambulatory Visit (HOSPITAL_COMMUNITY): Payer: Self-pay

## 2024-03-22 MED ORDER — HYDROXYZINE PAMOATE 25 MG PO CAPS
25.0000 mg | ORAL_CAPSULE | Freq: Three times a day (TID) | ORAL | 3 refills | Status: AC | PRN
  Filled 2024-03-22 – 2024-04-26 (×2): qty 90, 30d supply, fill #0
  Filled ????-??-??: fill #0

## 2024-03-22 MED ORDER — CLOMIPHENE CITRATE 50 MG PO TABS
50.0000 mg | ORAL_TABLET | Freq: Every day | ORAL | 0 refills | Status: DC
  Filled 2024-03-22 – 2024-04-26 (×3): qty 5, 5d supply, fill #0

## 2024-03-22 MED ORDER — METFORMIN HCL ER 500 MG PO TB24
500.0000 mg | ORAL_TABLET | Freq: Every day | ORAL | 6 refills | Status: AC
  Filled 2024-03-22: qty 30, 30d supply, fill #0
  Filled 2024-04-26: qty 30, 30d supply, fill #1
  Filled 2024-04-26: qty 30, 30d supply, fill #0

## 2024-03-22 MED ORDER — ESCITALOPRAM OXALATE 20 MG PO TABS
20.0000 mg | ORAL_TABLET | Freq: Every day | ORAL | 3 refills | Status: AC
  Filled 2024-03-22: qty 30, 30d supply, fill #0

## 2024-03-22 MED ORDER — HYDROXYZINE PAMOATE 25 MG PO CAPS
25.0000 mg | ORAL_CAPSULE | Freq: Three times a day (TID) | ORAL | 3 refills | Status: AC | PRN
  Filled 2024-03-22: qty 90, 30d supply, fill #0

## 2024-03-22 MED ORDER — ESCITALOPRAM OXALATE 20 MG PO TABS
20.0000 mg | ORAL_TABLET | Freq: Every day | ORAL | 3 refills | Status: AC
  Filled 2024-03-22 – 2024-04-26 (×3): qty 90, 90d supply, fill #0
  Filled ????-??-??: fill #0

## 2024-03-25 ENCOUNTER — Other Ambulatory Visit (HOSPITAL_COMMUNITY): Payer: Self-pay

## 2024-04-03 ENCOUNTER — Other Ambulatory Visit (HOSPITAL_COMMUNITY): Payer: Self-pay

## 2024-04-26 ENCOUNTER — Other Ambulatory Visit (HOSPITAL_BASED_OUTPATIENT_CLINIC_OR_DEPARTMENT_OTHER): Payer: Self-pay

## 2024-05-06 ENCOUNTER — Other Ambulatory Visit (HOSPITAL_BASED_OUTPATIENT_CLINIC_OR_DEPARTMENT_OTHER): Payer: Self-pay

## 2024-05-06 ENCOUNTER — Other Ambulatory Visit (HOSPITAL_COMMUNITY): Payer: Self-pay

## 2024-05-06 MED ORDER — CLOMIPHENE CITRATE 50 MG PO TABS
100.0000 mg | ORAL_TABLET | Freq: Every day | ORAL | 0 refills | Status: AC
  Filled 2024-05-06 – 2024-05-31 (×2): qty 10, 5d supply, fill #0

## 2024-05-06 MED ORDER — METFORMIN HCL ER 500 MG PO TB24
1000.0000 mg | ORAL_TABLET | Freq: Every day | ORAL | 3 refills | Status: AC
  Filled 2024-05-06 – 2024-05-24 (×2): qty 60, 30d supply, fill #0

## 2024-05-07 ENCOUNTER — Other Ambulatory Visit (HOSPITAL_COMMUNITY): Payer: Self-pay

## 2024-05-09 ENCOUNTER — Other Ambulatory Visit (HOSPITAL_BASED_OUTPATIENT_CLINIC_OR_DEPARTMENT_OTHER): Payer: Self-pay

## 2024-05-16 ENCOUNTER — Other Ambulatory Visit (HOSPITAL_COMMUNITY): Payer: Self-pay

## 2024-05-20 ENCOUNTER — Other Ambulatory Visit (HOSPITAL_BASED_OUTPATIENT_CLINIC_OR_DEPARTMENT_OTHER): Payer: Self-pay

## 2024-05-24 ENCOUNTER — Other Ambulatory Visit (HOSPITAL_BASED_OUTPATIENT_CLINIC_OR_DEPARTMENT_OTHER): Payer: Self-pay

## 2024-05-25 ENCOUNTER — Other Ambulatory Visit (HOSPITAL_BASED_OUTPATIENT_CLINIC_OR_DEPARTMENT_OTHER): Payer: Self-pay

## 2024-05-31 ENCOUNTER — Other Ambulatory Visit (HOSPITAL_BASED_OUTPATIENT_CLINIC_OR_DEPARTMENT_OTHER): Payer: Self-pay

## 2024-06-01 ENCOUNTER — Other Ambulatory Visit (HOSPITAL_BASED_OUTPATIENT_CLINIC_OR_DEPARTMENT_OTHER): Payer: Self-pay
